# Patient Record
Sex: Female | Born: 1945 | Race: Black or African American | Hispanic: No | Smoking: Former smoker
Health system: Southern US, Community
[De-identification: ages and names within clinical notes are randomized; demographics above are authoritative.]

## PROBLEM LIST (undated history)

## (undated) DIAGNOSIS — E079 Disorder of thyroid, unspecified: Secondary | ICD-10-CM

## (undated) DIAGNOSIS — E785 Hyperlipidemia, unspecified: Secondary | ICD-10-CM

## (undated) DIAGNOSIS — I10 Essential (primary) hypertension: Secondary | ICD-10-CM

## (undated) HISTORY — PX: REPLACEMENT TOTAL KNEE: SUR1224

## (undated) HISTORY — PX: ABDOMINAL HYSTERECTOMY: SHX81

## (undated) HISTORY — PX: BUNIONECTOMY: SHX129

## (undated) SURGERY — FIXATION, FRACTURE, INTERTROCHANTERIC, WITH INTRAMEDULLARY ROD
Anesthesia: Choice | Laterality: Right

---

## 2000-06-24 ENCOUNTER — Encounter: Payer: Self-pay | Admitting: Internal Medicine

## 2000-06-24 ENCOUNTER — Encounter: Admission: RE | Admit: 2000-06-24 | Discharge: 2000-06-24 | Payer: Self-pay | Admitting: Internal Medicine

## 2001-05-28 ENCOUNTER — Encounter: Payer: Self-pay | Admitting: Internal Medicine

## 2001-05-28 ENCOUNTER — Encounter: Admission: RE | Admit: 2001-05-28 | Discharge: 2001-05-28 | Payer: Self-pay | Admitting: Internal Medicine

## 2001-06-25 ENCOUNTER — Encounter: Payer: Self-pay | Admitting: Internal Medicine

## 2001-06-25 ENCOUNTER — Encounter: Admission: RE | Admit: 2001-06-25 | Discharge: 2001-06-25 | Payer: Self-pay | Admitting: Internal Medicine

## 2001-07-06 ENCOUNTER — Ambulatory Visit (HOSPITAL_COMMUNITY): Admission: RE | Admit: 2001-07-06 | Discharge: 2001-07-06 | Payer: Self-pay | Admitting: Cardiology

## 2001-07-06 ENCOUNTER — Encounter: Payer: Self-pay | Admitting: Cardiology

## 2002-08-24 ENCOUNTER — Encounter: Admission: RE | Admit: 2002-08-24 | Discharge: 2002-08-24 | Payer: Self-pay | Admitting: Internal Medicine

## 2002-08-24 ENCOUNTER — Encounter: Payer: Self-pay | Admitting: Internal Medicine

## 2003-07-12 ENCOUNTER — Encounter: Admission: RE | Admit: 2003-07-12 | Discharge: 2003-07-12 | Payer: Self-pay | Admitting: Internal Medicine

## 2004-09-05 ENCOUNTER — Encounter: Admission: RE | Admit: 2004-09-05 | Discharge: 2004-09-05 | Payer: Self-pay | Admitting: Internal Medicine

## 2005-10-28 ENCOUNTER — Encounter: Admission: RE | Admit: 2005-10-28 | Discharge: 2005-10-28 | Payer: Self-pay | Admitting: Internal Medicine

## 2006-10-29 ENCOUNTER — Encounter: Admission: RE | Admit: 2006-10-29 | Discharge: 2006-10-29 | Payer: Self-pay | Admitting: Internal Medicine

## 2007-08-16 ENCOUNTER — Inpatient Hospital Stay (HOSPITAL_COMMUNITY): Admission: EM | Admit: 2007-08-16 | Discharge: 2007-08-24 | Payer: Self-pay | Admitting: Emergency Medicine

## 2007-08-17 ENCOUNTER — Ambulatory Visit: Payer: Self-pay | Admitting: Surgery

## 2007-08-17 ENCOUNTER — Encounter (INDEPENDENT_AMBULATORY_CARE_PROVIDER_SITE_OTHER): Payer: Self-pay | Admitting: Internal Medicine

## 2007-08-20 ENCOUNTER — Ambulatory Visit: Payer: Self-pay | Admitting: Physical Medicine & Rehabilitation

## 2007-08-24 ENCOUNTER — Inpatient Hospital Stay (HOSPITAL_COMMUNITY)
Admission: RE | Admit: 2007-08-24 | Discharge: 2007-09-01 | Payer: Self-pay | Admitting: Physical Medicine & Rehabilitation

## 2007-08-24 ENCOUNTER — Ambulatory Visit: Payer: Self-pay | Admitting: Physical Medicine & Rehabilitation

## 2007-10-12 ENCOUNTER — Encounter
Admission: RE | Admit: 2007-10-12 | Discharge: 2007-10-12 | Payer: Self-pay | Admitting: Physical Medicine & Rehabilitation

## 2007-10-12 ENCOUNTER — Ambulatory Visit: Payer: Self-pay | Admitting: Physical Medicine & Rehabilitation

## 2007-11-16 ENCOUNTER — Emergency Department (HOSPITAL_COMMUNITY): Admission: EM | Admit: 2007-11-16 | Discharge: 2007-11-17 | Payer: Self-pay | Admitting: Emergency Medicine

## 2007-12-29 ENCOUNTER — Ambulatory Visit (HOSPITAL_COMMUNITY): Admission: RE | Admit: 2007-12-29 | Discharge: 2007-12-29 | Payer: Self-pay | Admitting: Unknown Physician Specialty

## 2008-06-06 ENCOUNTER — Other Ambulatory Visit: Admission: RE | Admit: 2008-06-06 | Discharge: 2008-06-06 | Payer: Self-pay | Admitting: Nephrology

## 2009-01-02 ENCOUNTER — Inpatient Hospital Stay (HOSPITAL_COMMUNITY): Admission: RE | Admit: 2009-01-02 | Discharge: 2009-01-08 | Payer: Self-pay | Admitting: Orthopedic Surgery

## 2009-04-02 ENCOUNTER — Encounter: Admission: RE | Admit: 2009-04-02 | Discharge: 2009-04-02 | Payer: Self-pay | Admitting: Nephrology

## 2009-11-13 ENCOUNTER — Inpatient Hospital Stay (HOSPITAL_COMMUNITY): Admission: RE | Admit: 2009-11-13 | Discharge: 2009-11-17 | Payer: Self-pay | Admitting: Orthopedic Surgery

## 2010-04-04 ENCOUNTER — Encounter: Admission: RE | Admit: 2010-04-04 | Discharge: 2010-04-04 | Payer: Self-pay | Admitting: Nephrology

## 2010-09-10 ENCOUNTER — Encounter
Admission: RE | Admit: 2010-09-10 | Discharge: 2010-09-10 | Payer: Self-pay | Source: Home / Self Care | Attending: Nephrology | Admitting: Nephrology

## 2010-12-01 LAB — BASIC METABOLIC PANEL
BUN: 5 mg/dL — ABNORMAL LOW (ref 6–23)
BUN: 8 mg/dL (ref 6–23)
CO2: 26 mEq/L (ref 19–32)
CO2: 26 mEq/L (ref 19–32)
Calcium: 8.6 mg/dL (ref 8.4–10.5)
Calcium: 8.6 mg/dL (ref 8.4–10.5)
Chloride: 105 mEq/L (ref 96–112)
Chloride: 106 mEq/L (ref 96–112)
Creatinine, Ser: 0.61 mg/dL (ref 0.4–1.2)
Creatinine, Ser: 0.63 mg/dL (ref 0.4–1.2)
Creatinine, Ser: 0.63 mg/dL (ref 0.4–1.2)
GFR calc Af Amer: >60 mL/min (ref >60)
GFR calc non Af Amer: >60 mL/min (ref >60)
Glucose, Bld: 130 mg/dL — ABNORMAL HIGH (ref 70–99)
Glucose, Bld: 136 mg/dL — ABNORMAL HIGH (ref 70–99)
Glucose, Bld: 140 mg/dL — ABNORMAL HIGH (ref 70–99)
Potassium: 3.8 mEq/L (ref 3.5–5.1)
Sodium: 136 mEq/L (ref 135–145)

## 2010-12-01 LAB — CBC
HCT: 30.8 % — ABNORMAL LOW (ref 36.0–46.0)
HCT: 41.6 % (ref 36.0–46.0)
Hemoglobin: 10.2 g/dL — ABNORMAL LOW (ref 12.0–15.0)
Hemoglobin: 13.8 g/dL (ref 12.0–15.0)
Hemoglobin: 8.5 g/dL — ABNORMAL LOW (ref 12.0–15.0)
MCHC: 33.1 g/dL (ref 30.0–36.0)
MCHC: 33.1 g/dL (ref 30.0–36.0)
MCHC: 33.1 g/dL (ref 30.0–36.0)
MCHC: 33.5 g/dL (ref 30.0–36.0)
MCV: 80.9 fL (ref 78.0–100.0)
MCV: 81.4 fL (ref 78.0–100.0)
MCV: 81.7 fL (ref 78.0–100.0)
Platelets: 213 10*3/uL (ref 150–400)
Platelets: 221 K/uL (ref 150–400)
Platelets: 327 K/uL (ref 150–400)
RBC: 3.78 MIL/uL — ABNORMAL LOW (ref 3.87–5.11)
RBC: 5.14 MIL/uL — ABNORMAL HIGH (ref 3.87–5.11)
RDW: 15.6 % — ABNORMAL HIGH (ref 11.5–15.5)
RDW: 15.6 % — ABNORMAL HIGH (ref 11.5–15.5)
RDW: 15.8 % — ABNORMAL HIGH (ref 11.5–15.5)
RDW: 15.9 % — ABNORMAL HIGH (ref 11.5–15.5)
WBC: 10.7 K/uL — ABNORMAL HIGH (ref 4.0–10.5)
WBC: 11.1 10*3/uL — ABNORMAL HIGH (ref 4.0–10.5)
WBC: 6.8 K/uL (ref 4.0–10.5)

## 2010-12-01 LAB — URINALYSIS, ROUTINE W REFLEX MICROSCOPIC
Glucose, UA: NEGATIVE mg/dL
Hgb urine dipstick: NEGATIVE
Ketones, ur: 15 mg/dL — AB
Nitrite: NEGATIVE
Protein, ur: 30 mg/dL — AB
Specific Gravity, Urine: 1.026 (ref 1.005–1.030)
Urobilinogen, UA: 0.2 mg/dL (ref 0.0–1.0)
pH: 6.5 (ref 5.0–8.0)

## 2010-12-01 LAB — BASIC METABOLIC PANEL WITH GFR
BUN: 11 mg/dL (ref 6–23)
CO2: 27 meq/L (ref 19–32)
Calcium: 10.1 mg/dL (ref 8.4–10.5)
Chloride: 104 meq/L (ref 96–112)
Creatinine, Ser: 0.69 mg/dL (ref 0.4–1.2)
GFR calc non Af Amer: >60 mL/min
Glucose, Bld: 111 mg/dL — ABNORMAL HIGH (ref 70–99)
Potassium: 4.1 meq/L (ref 3.5–5.1)
Sodium: 141 meq/L (ref 135–145)

## 2010-12-01 LAB — PROTIME-INR
INR: 0.98 (ref 0.00–1.49)
INR: 1.45 (ref 0.00–1.49)
INR: 1.45 (ref 0.00–1.49)
Prothrombin Time: 12.9 s (ref 11.6–15.2)
Prothrombin Time: 17.4 seconds — ABNORMAL HIGH (ref 11.6–15.2)
Prothrombin Time: 17.5 seconds — ABNORMAL HIGH (ref 11.6–15.2)

## 2010-12-01 LAB — URINE MICROSCOPIC-ADD ON

## 2010-12-01 LAB — APTT: aPTT: 30 s (ref 24–37)

## 2010-12-17 LAB — CBC
HCT: 29.9 % — ABNORMAL LOW (ref 36.0–46.0)
Hemoglobin: 10.1 g/dL — ABNORMAL LOW (ref 12.0–15.0)
MCHC: 33.6 g/dL (ref 30.0–36.0)
MCV: 78.9 fL (ref 78.0–100.0)
Platelets: 330 10*3/uL (ref 150–400)
RBC: 3.8 MIL/uL — ABNORMAL LOW (ref 3.87–5.11)
RDW: 15.9 % — ABNORMAL HIGH (ref 11.5–15.5)
WBC: 8.6 10*3/uL (ref 4.0–10.5)

## 2010-12-17 LAB — BASIC METABOLIC PANEL
BUN: 8 mg/dL (ref 6–23)
CO2: 27 mEq/L (ref 19–32)
Calcium: 8.7 mg/dL (ref 8.4–10.5)
Chloride: 102 mEq/L (ref 96–112)
Creatinine, Ser: 0.57 mg/dL (ref 0.4–1.2)
GFR calc Af Amer: >60 mL/min (ref >60)
GFR calc non Af Amer: >60 mL/min (ref >60)
Glucose, Bld: 164 mg/dL — ABNORMAL HIGH (ref 70–99)
Potassium: 3.6 mEq/L (ref 3.5–5.1)
Sodium: 137 mEq/L (ref 135–145)

## 2010-12-17 LAB — PROTIME-INR
INR: 1.9 — ABNORMAL HIGH (ref 0.00–1.49)
INR: 2.1 — ABNORMAL HIGH (ref 0.00–1.49)
Prothrombin Time: 22.9 seconds — ABNORMAL HIGH (ref 11.6–15.2)
Prothrombin Time: 25.2 seconds — ABNORMAL HIGH (ref 11.6–15.2)

## 2010-12-18 LAB — BASIC METABOLIC PANEL
BUN: 9 mg/dL (ref 6–23)
CO2: 23 mEq/L (ref 19–32)
CO2: 24 mEq/L (ref 19–32)
CO2: 30 mEq/L (ref 19–32)
Calcium: 9.1 mg/dL (ref 8.4–10.5)
Calcium: 9.1 mg/dL (ref 8.4–10.5)
Chloride: 104 mEq/L (ref 96–112)
Creatinine, Ser: 0.6 mg/dL (ref 0.4–1.2)
Creatinine, Ser: 0.62 mg/dL (ref 0.4–1.2)
GFR calc Af Amer: >60 mL/min (ref >60)
GFR calc Af Amer: >60 mL/min (ref >60)
GFR calc Af Amer: >60 mL/min (ref >60)
GFR calc non Af Amer: >60 mL/min (ref >60)
GFR calc non Af Amer: >60 mL/min (ref >60)
Glucose, Bld: 116 mg/dL — ABNORMAL HIGH (ref 70–99)
Glucose, Bld: 126 mg/dL — ABNORMAL HIGH (ref 70–99)
Glucose, Bld: 181 mg/dL — ABNORMAL HIGH (ref 70–99)
Potassium: 3.8 mEq/L (ref 3.5–5.1)
Potassium: 4.4 mEq/L (ref 3.5–5.1)
Sodium: 137 mEq/L (ref 135–145)
Sodium: 138 mEq/L (ref 135–145)
Sodium: 140 mEq/L (ref 135–145)

## 2010-12-18 LAB — PROTIME-INR
INR: 1.1 (ref 0.00–1.49)
INR: 1.4 (ref 0.00–1.49)
Prothrombin Time: 14.4 seconds (ref 11.6–15.2)

## 2010-12-18 LAB — CBC
HCT: 28.9 % — ABNORMAL LOW (ref 36.0–46.0)
HCT: 29.3 % — ABNORMAL LOW (ref 36.0–46.0)
HCT: 41.9 % (ref 36.0–46.0)
Hemoglobin: 10.8 g/dL — ABNORMAL LOW (ref 12.0–15.0)
Hemoglobin: 13.9 g/dL (ref 12.0–15.0)
Hemoglobin: 9.7 g/dL — ABNORMAL LOW (ref 12.0–15.0)
Hemoglobin: 9.8 g/dL — ABNORMAL LOW (ref 12.0–15.0)
MCHC: 32.8 g/dL (ref 30.0–36.0)
MCHC: 33.1 g/dL (ref 30.0–36.0)
MCHC: 33.4 g/dL (ref 30.0–36.0)
MCHC: 33.6 g/dL (ref 30.0–36.0)
Platelets: 266 10*3/uL (ref 150–400)
RBC: 3.65 MIL/uL — ABNORMAL LOW (ref 3.87–5.11)
RBC: 3.7 MIL/uL — ABNORMAL LOW (ref 3.87–5.11)
RBC: 5.24 MIL/uL — ABNORMAL HIGH (ref 3.87–5.11)
RDW: 15.7 % — ABNORMAL HIGH (ref 11.5–15.5)
RDW: 15.9 % — ABNORMAL HIGH (ref 11.5–15.5)
RDW: 16 % — ABNORMAL HIGH (ref 11.5–15.5)
RDW: 16.1 % — ABNORMAL HIGH (ref 11.5–15.5)
WBC: 17.3 10*3/uL — ABNORMAL HIGH (ref 4.0–10.5)

## 2010-12-18 LAB — URINALYSIS, ROUTINE W REFLEX MICROSCOPIC
Hgb urine dipstick: NEGATIVE
Nitrite: NEGATIVE
Specific Gravity, Urine: 1.022 (ref 1.005–1.030)
Urobilinogen, UA: 1 mg/dL (ref 0.0–1.0)
pH: 6 (ref 5.0–8.0)

## 2010-12-18 LAB — TYPE AND SCREEN
ABO/RH(D): O POS
Antibody Screen: NEGATIVE

## 2010-12-18 LAB — APTT: aPTT: 30 seconds (ref 24–37)

## 2011-01-21 NOTE — Op Note (Signed)
NAME:  Stacey Lambert, Stacey Lambert NO.:  [id number]   MEDICAL RECORD NO.:  [id number]          PATIENT TYPE:  INP   LOCATION:  5002                         FACILITY:  [facility]   PHYSICIAN:  Eulas Post, MD    DATE OF BIRTH:  September 26, 1945   DATE OF PROCEDURE:  01/02/2009  DATE OF DISCHARGE:                               OPERATIVE REPORT   ATTENDING SURGEON:  Eulas Post, MD   FIRST ASSISTANT:  Skip Mayer, PA-C   PREOPERATIVE DIAGNOSIS:  Right knee valgus osteoarthritis.   POSTOPERATIVE DIAGNOSIS:  Right knee valgus osteoarthritis.   OPERATIVE PROCEDURE:  Right total knee arthroplasty.   OPERATIVE IMPLANTS:  DePuy PFC Sigma posterior stabilized size 4 narrow  femoral component with a size 3 tibial tray and a size 8-mm fixed  bearing PFC Sigma cross-link stabilized insert with a size 38-mm oval  bone patella, 3 peg button and 40 grams of SmartSet viscosity bone  cement.   PREOPERATIVE INDICATIONS:  Mrs. Stacey Lambert is a 65 year old woman who  had severe end-stage right knee arthritis.  She had failed conservative  management and elected to undergo right total knee arthroplasty.  The  risks, benefits, and alternatives were discussed with her preoperatively  including but not limited risks of infection, bleeding, nerve injury,  instability, the need for revision surgery, stiffness, blood clots,  blood transfusion, cardiopulmonary complications, among others and she  was willing to proceed.   OPERATIVE PROCEDURE:  The patient was brought to the operating room and  placed in supine position.  General anesthesia was administered.  Intravenous Ancef 1 gram was given.  A Foley was placed.  The right  lower extremity was prepped and draped in the usual sterile fashion.  The leg was elevated, exsanguinated, and the tourniquet was inflated.  She had a preoperative flexion contracture of at least 25 degrees, maybe  30 degrees.  She also had slight valgus deformity.  We made  an anterior  midline incision with a vastus medialis muscle splitting approach.  The  patella was everted.  We removed osteophytes and resected our proximal  tibia taking care to protect the collateral ligaments.  Minimal medial  dissection was performed.  We resected 4 mm off the medial side, taking  care to take as little as possible off the lateral side.  We turned our  attention to the femur.  We used the intramedullary femoral cutting jig  and resected the distal femur at 11 mm.  The jig was set to 5 degrees  right.  After resecting this, we tested our extension gap which had  significant flexion contracture and tightness and did not balance  adequately.  Therefore, we resected two more millimeters of tibia.  We  then turned our attention back to the femur.  We resected the menisci  and placed our sizing guide.  There was hypoplasia of the lateral  femoral condyle and we externally rotated the guide appropriately using  a Freer interposed between the bone and the guide in order to achieve  external rotation.  We then pinned this and measured  the femur and  placed our 4-in-1 cutting jig.  The cut was flushed with the anterior  bone and there was no notching.  The posterior condyles were  appropriately resected.  We then used a osteotome and curette to remove  the large hypertrophic posterior osteophytes.  We resected the PCL and  the ACL and recessed the posterior capsule.  We also performed our  chamfer cuts.   We then turned our attention to measuring the gaps.  The flexion gap was  symmetric at 10 mm at this point, but the extension gap was still fairly  tight at 10 mm.  We released the popliteus as well as resected the  capsule along the posterior tibia and posterior femur in order to  achieve more extension.  We then retrialed our blocks and the extension  gap was much improved, although it was still slightly tight at 10 mm.   We turned our attention to the patella.  The  patella measured 24 mm and  afterwards measured 15.5 mm.  We sized it to a 38 and drilled the button  holes.  We placed trial components and the patella was found to have  excellent tracking.  Decision was made to perform a fixed bearing knee,  in part because the patella tracked well, but also because it allowed [place]  the option to go down to an 8-mm insert.  Therefore, the proximal tibia  was prepared and appropriate external rotation achieved and then we  removed all the instruments and irrigated the knee copiously.   We then cemented all of the components into place.  A 10-mm trial was  placed, which still had residual 7-degree flexion contracture.  The  overall alignment was much improved.  The patella tracked very well.  Once all the cement had cured, we changed to an 8-mm insert which  allowed the knee to go to full extension.  It had excellent flexion up  to 120 degrees.  We irrigated the wounds copiously once more and  released the tourniquet.  Total tourniquet time was 2 hours at 350 mmHg.  We then irrigated again and closed the parapatellar tissue with #1  Vicryl followed by 2-0 and 3-0 for the subcutaneous tissue followed by 4-  0 Monocryl and Steri-Strips with sterile gauze for the skin.  The  patient was awakened and returned to PACU in stable and satisfactory  condition.  There were no complications.  The patient tolerated the  procedure well.      Eulas Post, MD  Electronically Signed     JPL/MEDQ  D:  01/02/2009  T:  01/03/2009  Job:  [id number]

## 2011-01-21 NOTE — Discharge Summary (Signed)
NAME:  Stacey Lambert, Stacey Lambert NO.:  [id number]   MEDICAL RECORD NO.:  [id number]          PATIENT TYPE:  IPS   LOCATION:  4039                         FACILITY:  [facility]   PHYSICIAN:  Ranelle Oyster, M.D.DATE OF BIRTH:  [birth year]   DATE OF ADMISSION:  08/24/2007  DATE OF DISCHARGE:  09/01/2007                               DISCHARGE SUMMARY   DISCHARGE DIAGNOSES:  1. Left hemispheric infarction.  2. Status post non-ST myocardial infarction.  3. Hypertension.  4. Depression.  5. Hyperlipidemia.  6. Hyperthyroidism.  7. Borderline diabetes mellitus.   This is a 65 year old female admitted December 8 with right-sided  weakness.  MRI showed a 3-mm infarction white matter left hemisphere.  Carotid Dopplers negative for internal carotid artery stenosis.  Echocardiogram with ejection fraction 40% with severe hypokinesis.  Routine cardiac enzymes positive. EKG showed T-wave inversion anterior  leads suspect non-ST myocardial infarction.  Cardiac catheterization  December 10 per Dr. Aleen Campi with normal coronary arteries.  Placed on  aspirin, Plavix therapy.  Noted hypothyroidism with TSH of 0.007 with a  home supplement of 150 mcg decreased to 50 mg. Blood sugars monitored in  the 120s to 140s with diet control added.   PAST MEDICAL HISTORY:  See discharge diagnoses.  No alcohol.  Noted  history of tobacco use.   ALLERGIES:  Negative.   SOCIAL HISTORY:  Widowed, lives alone.  She works for a [employer], local family works, one level home, one step to entry.   MEDICATIONS PRIOR TO ADMISSION:  1. Zebeta 10 mg daily.  2. Synthroid 150 mcg daily.  3. Pravachol 20 mg at bedtime.  4. Lisinopril 10 mg daily.  5. Celexa 40 mg daily.   REHABILITATION AND HOSPITAL COURSE:  The patient was admitted to  inpatient rehab services with therapies initiated on a 3-hour daily  basis consisting of physical therapy, occupational therapy and  rehabilitation nursing. The  following issues were addressed during the  patient's rehabilitation stay.  Pertaining to [name]'s left  hemispheric infarction, she remained on aspirin and Plavix therapy.  Functionally she was progressing in all areas requiring basic  supervision for transfers, minimum assist for stairs, ambulating 150  feet with a rolling walker, distant supervision for activities of daily  living.  Blood pressure is monitored on lisinopril,  Lopressor with no  orthostatic changes. Diastolic pressure is 73-82.  She had a history of  depression.  She remained on Celexa as prior hospital admission.  She  had no bowel or bladder disturbances. She was discharged to home in  stable condition.   DISCHARGE MEDICATIONS:  1. Aspirin 325 mg daily.  2. Protonix 40 mg daily.  3. Lisinopril 20 mg daily.  4. Celexa 40 mg daily.  5. Lopressor 25 mg every 6 hours.  6. Zocor 40 mg daily.  7. Xanax 0.5 mg twice daily.  8. Lyrica 75 mg twice daily.  9. Plavix 75 mg daily.  10.Synthroid 50 mcg daily.  11.Tylenol as needed.   ACTIVITY:  As tolerated.   DIET:  Diabetic diet.   SPECIAL  INSTRUCTIONS:  Follow-up with Dr. Faith Rogue at the  outpatient rehab service office as advised.  Follow-up with primary care  physician in 2 weeks.      Stacey Lambert, P.A.      Ranelle Oyster, M.D.  Electronically Signed    DA/MEDQ  D:  08/31/2007  T:  08/31/2007  Job:  [id number]   cc:   Antionette Char, MD  Massie Maroon, MD  Pramod P. Pearlean Brownie, MD

## 2011-01-21 NOTE — Assessment & Plan Note (Signed)
HISTORY:  Stacey Lambert is back regarding her stroke.  She was on rehab until  September 01, 2007, and has been at home.  She has received home  therapies and essentially has been discharged.  Her main issue is more  related to her chronic knee pain, right more than left.  She requires a  cane for balance and has a significant limp due to her right knee pain.  She also feels that the right knee is going to give away.  She still has  a bit of weakness on the right side and occasional spasms, as related to  her stroke.  She has some loss of eye motor coordination as well, but is  very happy overall with her neurological progress.  Sleep is fair.  She  says the pain is worse with walking and standing and improves with rest,  heat and medications.   SOCIAL HISTORY:  The patient had been working as a Office manager person,  managing a group of guards, etc.  Her job required a lot of walking and  she does not know if she will be able to return to that, due to her  stroke and arthritic issues.   REVIEW OF SYSTEMS:  Notable for the above.  She has occasional night  sweats and shivers.  Other pertinent positives are above.   PHYSICAL EXAMINATION:  VITAL SIGNS:  Blood pressure 166/87, pulse 54,  respirations 16, saturation 98% on room air.  GENERAL:  The patient is pleasant, alert and oriented x3.  She remains  obese.  NEUROLOGIC/EXTREMITIES:  I watched her ambulate and she has significant  valgus deformities, right greater than left with significant antalgic to  the right with weightbearing.  Range of motion is a bit limited,  particularly with flexion, which causes pain.  She has significant  crepitus with range of motion both actively and passively.  Some  parapatellar edema is noted as well.  No lymph or skin breakdown.  There  are color changes noted at the knees today.  She had 4+/5 strength in  the right arm and leg.  She lacked some fine motor coordination of the  hand and foot, but minimally so today.   Cognition is appropriate.  Speech was articulate and clear.  Cranial nerve exam was intact and  within normal limits.  HEART:  Regular rhythm but slightly bradycardic.  CHEST:  Clear.  ABDOMEN:  Soft, nontender.   ASSESSMENT:  1. Left subcortical stroke.  2. Hypertension.  3. Osteoarthritis of the knees, right greater than left.  4. History of depression.  5. Obesity.  6. Borderline diabetes.   PLAN:  1. After an informed consent, we injected the right knee with 40 mg of      Kenalog and 3 mL of 1% lidocaine.  2. I ordered her an AVQ ArthroCare brace with bilateral stability      supports, to see if she can improve her stability in ambulation.      She will likely ultimately need knee replacements.  I encouraged      weight loss and strengthening of the limbs.   FOLLOWUP:  I will see her back in about three months' time.  She will  call me with any questions or issues.      Ranelle Oyster, M.D.  Electronically Signed     ZTS/MedQ  D:  10/12/2007 11:03:32  T:  10/12/2007 16:37:08  Job #:  [id number]   cc:   Massie Maroon, MD  Fax: [phone]

## 2011-01-21 NOTE — Consult Note (Signed)
NAME:  Stacey Lambert, Stacey Lambert                 ACCOUNT NO.:  [id number]   MEDICAL RECORD NO.:  [id number]          PATIENT TYPE:  INP   LOCATION:  4705                         FACILITY:  [facility]   PHYSICIAN:  Pramod P. Pearlean Brownie, MD    DATE OF BIRTH:  [birth year]   DATE OF CONSULTATION:  08/16/2007  DATE OF DISCHARGE:                                 CONSULTATION   REASON FOR CONSULTATION:  TIA.   HISTORY OF PRESENT ILLNESS:  Stacey Lambert is a 65 year old, Caucasian lady  who initially noticed right leg paresthesias yesterday evening.  She  went to sleep and woke up this morning and she was alright, but at about  8 a.m., she noticed transient, right-sided weakness and paresthesias  lasting about 5-10 minutes and recovered.  She has had a total of six  recurrent episodes today including two of them in the emergency room as  well.  I saw her briefly during one of the episodes when she clearly had  some right facial droop and mild right-sided weakness and some  subjective paresthesias, but this resolved within a few minutes.  She  has had no known prior history of stroke or TIAs.   PAST MEDICAL HISTORY:  1. Hypertension.  2. Hyperlipidemia.  3. Hypothyroidism.   HOME MEDICATIONS:  Pravachol, Synthroid, lisinopril, Celexa.   ALLERGIES:  No known drug allergies.   SOCIAL HISTORY:  The patient lives alone.  She is independent in  activities of daily living.  Does not smoke or drink.   FAMILY HISTORY:  Noncontributory.   REVIEW OF SYSTEMS:  Negative for any fever, cough, chest pain, diarrhea  or other illness.   PHYSICAL EXAMINATION:  GENERAL:  A pleasant, middle-aged, African  American lady who at present not in distress.  VITAL SIGNS:  She is febrile.  Pulse rate of 82 per minute and regular,  blood pressure 189/90, respiratory 16, temperature 97.9, saturations 98%  room air.  HEENT:  Head is nontraumatic.  Unremarkable.  NECK:  Supple.  There is no bruit.  CARDIAC:  Regular heart sounds.  LUNGS:  Clear to auscultation.  ABDOMEN:  Soft, nontender.  NEUROLOGIC:  The patient is awake, alert, oriented to time, place and  person.  There is no aphasia, no nystagmus.  Face is symmetric.  Tongue  is midline.  Motor system exam with normal symmetric strength, tone,  reflexes, coordination and sensation.  Her gait was not tested.   LABORATORY DATA AND X-RAY FINDINGS:  CT scan of the head with  noncontrast study reveals no acute abnormality.   IMPRESSION:  A 65-year lady with recurrent transient episodes of right-  sided weakness and numbness which have improved.  This likely represents  left hemispheric transient ischemic attack with small infarct.  Etiology  is most likely small-vessel disease.  The patient is being admitted to  the hospital service for further workup.   RECOMMENDATIONS:  I would recommend checking MRI scan of the brain with  MRA of the brain, carotid transcranial Doppler studies, 2-D  echocardiogram, lipid profile, hemoglobin A1c, homocystine and telemetry  monitoring.  Start  aspirin for secondary stroke prevention and will be  happy to follow the patient in consult.  Kindly call for questions.           ______________________________  Sunny Schlein. Pearlean Brownie, MD     PPS/MEDQ  D:  08/16/2007  T:  08/17/2007  Job:  [id number]

## 2011-01-21 NOTE — Discharge Summary (Signed)
NAME:  Stacey Lambert, Stacey Lambert                 ACCOUNT NO.:  [id number]   MEDICAL RECORD NO.:  [id number]          PATIENT TYPE:  INP   LOCATION:  4738                         FACILITY:  [facility]   PHYSICIAN:  Herbie Saxon, MDDATE OF BIRTH:  July 09, 1946   DATE OF ADMISSION:  08/16/2007  DATE OF DISCHARGE:                               DISCHARGE SUMMARY   This is an interim discharge summary.   DISCHARGE DIAGNOSES:  1. Acute left cerebrovascular accident.  2. Myocarditis.  3. Cardiomyopathy with left ventricular dysfunction.  4. Dyslipidemia.  5. Depression, anxiety.  6. Knee osteoarthritis.  7. History of tobacco use.  8. Hypothyroidism.  9. Restless legs syndrome.   CONSULTS:  1. Dr. Pearlean Brownie, neurology.  2. Dr. Aleen Campi, cardiology.   The MRI of the brain of August 16, 2007 did show an acute infract in  the left cerebral hemisphere.  The patient had cardiac catheterization  performed on August 18, 2007 by the cardiologist, Dr. Aleen Campi.  This  revealed normal coronary arteries; however, she was noticed to have left  ventricular dysfunction.  The patient was continued on Zebeta, and  lisinopril dose was increased.   The patient's right hemiplegia has been gradually improving.  At  present, she is ambulating with physical therapy support.  Her blood  pressure has been adequately controlled, and her mood is stable.  This  patient is agreeable to rehab placement.  Also, her daughter is  agreeable with this plan.   She was transferred from the intensive care unit by August 19, 2007,  and her neurological function has been improved since then.  Her  swallowing evaluation showed no overt signs of aspiration.  The ejection  fraction is 40% with severe hypokinesia and focal akinesia on the medial  and anterior walls.   Her TSH was noted to be low, and Synthroid dose has been reduced to 100  mcg daily.  She has been continued on aspirin and Plavix.  Neurology  does not feel there  is any need for warfarin at this stage.  Synthroid  dose was further reduced to 50 mcg daily.  The repeat TSH was low at  0.007.   Diabetes control is stable while awaiting CRR bed placement.   DISCHARGE CONDITION:  Stable.   Final medications to be dictated on discharge.   PHYSICAL EXAMINATION:  On examination today, she is an elderly lady in  no acute distress.  Temperature 98, pulse 80, respiratory rate 20, blood pressure 130/70.  Pupils are equal, round and reactive to light and accommodation.  Extraocular muscles are intact.  Heart sounds 1 and 2 are regular.  CHEST:  Clinically clear.  ABDOMEN:  Soft, nontender.  No organomegaly.  Bowel sounds are  normoactive.  She is alert and oriented x3.  She has mild right hemiparesis.  Cranial  nerves II-XII are intact.  Peripheral pulses are present, no pedal  edema.  She has mild ataxia.   Glucose level is 131.  WBC is 9, hematocrit 36, platelet count 279.  The  TSH on August 20, 2007 showed a level of 0.007.  It is noted that the  initial leukocytosis has resolved.   DISPOSITION:  To CRR rehab with PT/OT followup.  She is to be seen as an  outpatient.  Will be followed up by her primary care physician, Dr. Selena Batten,  one week after discharge.  To follow up with Dr. Aleen Campi in 2-3 weeks  after discharge.  Dr. Pearlean Brownie, neurologist, 4-6 weeks after discharge.   At present, the medications she is on include aspirin 325 mg daily,  Protonix 40 mg daily, lisinopril 20 mg daily, Celexa 40 mg daily,  Lopressor 25 mg q.6h. p.o., Zocor 40 mg daily, Xanax 0.5 mg b.i.d.,  Lyrica 75 mg b.i.d., Plavix 75 mg daily, Synthroid 50 mcg daily, Reglan  70 mg p.o. q.8h. p.r.n., oxycodone 5 mg q.6h. p.r.n., Ambien 5-10 mg  nightly p.r.n.   This could be updated if patient is not discharged today.      Herbie Saxon, MD  Electronically Signed     MIO/MEDQ  D:  08/23/2007  T:  08/23/2007  Job:  [id number]

## 2011-01-21 NOTE — H&P (Signed)
NAME:  Stacey Lambert, Stacey Lambert                 ACCOUNT NO.:  [id number]   MEDICAL RECORD NO.:  [id number]          PATIENT TYPE:  EMS   LOCATION:  MINO                         FACILITY:  [facility]   PHYSICIAN:  Ladell Pier, M.D.   DATE OF BIRTH:  [birth year]   DATE OF ADMISSION:  08/16/2007  DATE OF DISCHARGE:                              HISTORY & PHYSICAL   CHIEF COMPLAINT:  Right sided weakness.   HISTORY OF PRESENT ILLNESS:  The patient is a 65 year old African-  American female with past medical history significant for hypertension,  dyslipidemia, and tobacco use. The patient stated that last night,  before she went to bed, she noted that her right side was weak. She  thought she was having cramps in her right lower extremity when she  tried to use her left hand to raise herself up off the bed to switch  positions that was difficult to do secondary to weakness in her left  hand. Went to bed, woke up this morning, went to work, and when she was  trying to use the keyboard, she noticed that her fingers just would not  work. When she tried to stand up, she noticed that her right leg was  weak again. She just felt bad all over. She felt nauseated but she had  no chest pain, no shortness of breath. She also noted some right facial  droop and difficulty finding words with slurred speech. She is now back  to baseline.   PAST MEDICAL HISTORY:  1. Osteoarthritis of the knees.  2. Hypertension.  3. Dyslipidemia.  4. Depression/anxiety.  5. Osteoarthritis of the knee.  6. Tobacco use.  7. Hypothyroidism.   FAMILY HISTORY:  Mother is 72 years old with hypertension, diabetes, and  history of stroke. Father is unknown. Siblings with hypertension.   SOCIAL HISTORY:  The patient smokes about 1/4 pack of cigarettes per  day. No alcohol use. She is a widow and she has 4 sons. She works at [employer].   MEDICATIONS:  The patient could not remember the list of mediations but  the ones from  the pharmacy that were recently refilled was Zebeta 10 mg  daily, Synthroid 150 mcg daily, Pravachol 20 mg q.h.s., Lisinopril 10 mg  daily, and Celexa 40 mg daily.   ALLERGIES:  NO KNOWN DRUG ALLERGIES.   REVIEW OF SYSTEMS:  See HPI.   PHYSICAL EXAMINATION:  VITAL SIGNS:  Temperature 97.7, blood pressure  190/92, pulse 67, respiratory rate 18, pulse ox 98% on room air.  HEENT:  Normocephalic and atraumatic. Pupils are reactive to light.  Throat without erythema.  CARDIOVASCULAR:  Regular rate and rhythm.  LUNGS:  Clear bilaterally.  ABDOMEN:  Positive bowel sounds.  EXTREMITIES:  Without edema.  NEUROLOGIC:  Examination is non-focal.   LABORATORY DATA:  Sodium 139, potassium 3.7, chloride 107, BUN 12,  creatinine of 0.6. Hemoglobin and hematocrit 17 and 50. WBC 10.8.  Hemoglobin 14.4. MCV of 78.9. Platelets 385,000. Urinalysis negative.   Head CT without any acute ischemia.   ASSESSMENT/PLAN:  1. Transient ischemic attack. Will  admit patient to the hospital. She      was taking a baby aspirin a day. Will increase that to a regular      aspirin. Will get carotid Doppler's, 2-D echocardiogram, fasting      lipid panel, cardiac enzymes, MRA, and also consult neurology.  2. Hypothyroidism. Will check a TSH level.  3. Dyslipidemia. Will check a fasting lipid panel. Continue Pravachol.  4. Hypertension. Blood pressure is mildly elevated now. Will restart      her no Zebeta and increase her Lisinopril to 20 mg daily.  5 .  Anxiety/depression. Will continue her on Celexa.      Ladell Pier, M.D.  Electronically Signed     NJ/MEDQ  D:  08/16/2007  T:  08/16/2007  Job:  [id number]   cc:   Massie Maroon, MD

## 2011-01-21 NOTE — Discharge Summary (Signed)
NAME:  Stacey Lambert, Stacey Lambert NO.:  [id number]   MEDICAL RECORD NO.:  [id number]          PATIENT TYPE:  IPS   LOCATION:  4039                         FACILITY:  [facility]   PHYSICIAN:  Ellwood Dense, M.D.   DATE OF BIRTH:  11/17/1945   DATE OF ADMISSION:  08/24/2007  DATE OF DISCHARGE:                               DISCHARGE SUMMARY   PRIMARY CARE PHYSICIAN:  Massie Maroon, MD.   NEUROLOGIST:  Pramod P. Pearlean Brownie, MD.   CARDIOLOGIST:  Antionette Char, MD.   HISTORY OF PRESENT ILLNESS:  Ms. Manner is a 65 year old African-  American female who was admitted August 24, 2007 with acute onset of  right-sided weakness.  MRI study showed 3 mm infarct in the left  hemisphere white matter.  Carotid Dopplers were negative for internal  carotid artery stenosis.  Echocardiogram showed ejection fraction of 40%  with severe hypokinesis.  Routine cardiac enzymes were positive and EKG  showed T-wave inversion of the anterior leads with suspected non-ST wave  elevated MI.  Cardiac catheterization was done August 18, 2007 by Dr.  Aleen Campi with normal coronary arteries reported.  She was placed on  aspirin and Plavix for stroke and myocardial infarction prophylaxis.  She was noted to have hypothyroidism with TSH of 0.07 and her home  Synthroid was decreased to 50 mcg p.o. daily.  Blood sugars have been in  the 120-140 range and hemoglobin A1c is pending with continued CBGs a.c.  and h.s.  The patient was evaluated by the rehabilitation physicians and  felt to be an appropriate candidate for inpatient rehabilitation.   REVIEW OF SYSTEMS:  Positive for depression and reflux.   PAST MEDICAL HISTORY:  1. Hypertension.  2. Dyslipidemia.  3. Depression.  4. Hypothyroidism.   FAMILY HISTORY:  Positive for diabetes mellitus and stroke.   SOCIAL HISTORY:  The patient is widowed and lives alone.  She works for  [employer] which apparently provides security to [employer].  She reports  use of tobacco on a daily basis and denies alcohol usage.  She lives in a one-level home.  Her local family works.   FUNCTIONAL HISTORY PRIOR TO ADMISSION:  Independent and working.   ALLERGIES:  NO KNOWN DRUG ALLERGIES.   PRIOR TO ADMISSION MEDICATIONS:  1. Zebeta 10 mg daily.  2. Synthroid 150 mcg p.o. daily.  3. Pravachol 20 mg p.o. q.h.s.  4. Lisinopril 10 mg daily.  5. Celexa 40 mg daily.   LABORATORY:  Recent hemoglobin was 12.2, hematocrit 36.0, platelet count  of 279,000, white count 9.1.  Recent CBGs have been 141, 128 and 104.  TSH was low at 0.007.  Hemoglobin A1c is pending.  Recent sodium was  139, potassium 3.7, chloride 106, bicarbonate 25, BUN 11, creatinine  0.7.   PHYSICAL EXAMINATION:  GENERAL:  Reasonably well-appearing, alert,  cooperative adult female seated up on the edge of the bed eating lunch.  VITAL SIGNS:  Blood pressure is 110/62 with pulse 67, straight ATN,  temperature 97.0.  HEENT: Normocephalic, nontraumatic.  CARDIOVASCULAR:  Regular rate and rhythm,  S1-S2 without murmurs.  ABDOMEN:  Soft, moderately obese, nontender with positive bowel sounds.  LUNGS:  Clear to auscultation bilaterally.  NEUROLOGIC:  Alert and oriented x 3.  Cranial nerves II-XII are grossly  intact.  EXTREMITIES:  Left upper left lower extremity strength were 4+ to -5/5  strength throughout.  Bulk and tone were normal.  Reflexes 2+ and  symmetrical.  Sensation was intact to light touch throughout the left  upper and left lower extremity.  Right upper extremity strength was 4-/5  with decreased coordination and movements distally in the right hand.  Sensation was intact to light touch with slight decrease distally in the  right upper extremity.  Right lower extremity exam showed 4-/5 strength  in flexion/extension ankle dorsiflexion.  Sensation was intact to light  touch throughout the right lower extremity.   IMPRESSION:  1. Status post left hemisphere infarct with  right-sided weakness, arm      greater than leg.  2. Recent cardiac catheterization for abnormal echocardiogram with      normal coronary arteries reported.  3. Hypothyroidism on supplement.  4. Hypertension.  5. Dyslipidemia.   Presently, the patient has deficits in ADLs, transfers and ambulation  related to the above noted left hemisphere infarct with right-sided  weakness.   PLAN:  1. Admit to the rehabilitation unit for daily physical therapy for      range of motion, strengthening, bed mobility, transfers, pre-gait      training, gait training and equipment evaluation.  2. Occupational therapy for range of motion, strengthening, ADLs,      cognitive/perceptual training, splinting and equipment evaluation.  3. Rehab nursing for skin care, wound care, bowel and bladder training      if necessary.  4. Case management to assess home environment, assist with discharge      planning and arrange for appropriate follow up care.  5. Social worker to assess family and social support, consultation      regarding disability issues and assist in discharge planning.  6. Continue high carbohydrate and modified diet.  7. CBGs a.c. and h.s.  8. Check admission labs including CBC and CMET in a.m. August 25, 2007.  9. Old EKG to chart.  10.Routine turning to prevent skin breakdown.   DISCHARGE MEDICATIONS:  1. Sliding-scale NovoLog insulin a.c. without h.s. coverage.  2. Aspirin 325 mg p.o. daily.  3. Protonix 40 mg p.o. daily.  4. Lisinopril 20 mg p.o. daily.  5. Celexa 40 mg p.o. daily.  6. Lopressor 25 mg p.o. q.6 h. holding for systolic blood pressure      less than 90 or heart rate less than 60.  7. Zocor 40 mg p.o. daily.  8. Xanax 0.5 mg p.o. b.i.d.  9. Lyrica 75 mg p.o. b.i.d.  10.Plavix 75 mg p.o. daily.  11.Synthroid 15 mcg p.o. daily.  12.Dulcolax suppository 1 per rectum daily p.r.n.   PROGNOSIS:  Good.   ESTIMATED LENGTH OF STAY:  5-10 days.   GOALS:  Modified  independent ADLs, transfers and ambulation.           ______________________________  Ellwood Dense, M.D.     DC/MEDQ  D:  08/24/2007  T:  08/24/2007  Job:  [id number]

## 2011-01-21 NOTE — Discharge Summary (Signed)
NAME:  Stacey Lambert, Stacey Lambert                 ACCOUNT NO.:  [id number]   MEDICAL RECORD NO.:  [id number]          PATIENT TYPE:  INP   LOCATION:  5002                         FACILITY:  [facility]   PHYSICIAN:  Eulas Post, MD    DATE OF BIRTH:  [birth year]   DATE OF ADMISSION:  01/02/2009  DATE OF DISCHARGE:  01/06/2009                               DISCHARGE SUMMARY   ADMISSION DIAGNOSIS:  Right knee valgus osteoarthritis.   POSTOPERATIVE OR DISCHARGE DIAGNOSIS:  Right knee valgus osteoarthritis.   PROCEDURE:  Right total knee arthroplasty, performed on January 02, 2009.   DISCHARGE MEDICATIONS:  Percocet, Valium, Phenergan, Coumadin.   HOSPITAL COURSE:  [name] is a 65 year old woman with severe  valgus osteoarthritis with a severe flexion contracture.  She elected to  undergo total knee arthroplasty.  The risks, benefits, and alternatives  were discussed.  She tolerated the procedure well and postoperatively  did not have any complications.  She was given IV PCA Dilaudid for pain  control early on, and then switched to p.o. analgesics.  She was given  perioperative antibiotics for an antimicrobial prophylaxis.  She was  given sequential compression devices and Coumadin for DVT prophylaxis.  She was ambulating well at the time of discharge with physical therapy.  Her wounds were clean, dry, and intact, and her dressings were changed  prior to discharge.  She remained hemodynamically stable and did not  require blood transfusion.  She benefited maximally from her hospital  stay and planned to be discharged home with followup with me in 2 weeks.  There were no complications.      Eulas Post, MD  Electronically Signed     JPL/MEDQ  D:  01/05/2009  T:  01/06/2009  Job:  [id number]

## 2011-01-21 NOTE — Cardiovascular Report (Signed)
NAMEMarland Kitchen  Stacey Lambert, Stacey Lambert                 ACCOUNT NO.:  [id number]   MEDICAL RECORD NO.:  [id number]          PATIENT TYPE:  INP   LOCATION:  2116                         FACILITY:  [facility]   PHYSICIAN:  Antionette Char, MD    DATE OF BIRTH:  January 05, 1946   DATE OF PROCEDURE:  08/18/2007  DATE OF DISCHARGE:                            CARDIAC CATHETERIZATION   PROCEDURES:  1. Left heart catheterization.  2. Coronary sine angiography.  3. Left ventricular cineangiography.  4. Abdominal aortogram.  5. Angio-Seal of the right femoral artery.   INDICATION FOR PROCEDURES:  This 65 year old female was admitted after a  stroke presentation to the emergency room.  She also had cardiac enzymes  done which were positive and her electrocardiogram changed showing T  wave inversion in her anterior leads.  With abnormal cardiac enzymes and  change in EKG, she was felt to have a non-ST elevation myocardial  infarction.  Because of the high risk for having a cardiac event, she  was then scheduled for a cardiac cath.   PROCEDURE:  After signing an informed consent, the patient was pre-  medicated with 5 mg of Valium by mouth and brought to the cardiac  catheterization lab at [facility].  Her right groin was prepped  and draped in a sterile fashion and anesthetized locally with 1%  lidocaine.  A 7-French introducer sheath was inserted percutaneously  into the right femoral artery using a Smart needle for guidance into the  artery.  A 6-French #4 Judkins coronary catheters were used to make  injections into the native coronary arteries.  A 6-French pigtail  catheter was used to measure pressures in the left ventricle and aorta  and to make mid stream injections into the left ventricle and abdominal  aorta.  The patient tolerated the procedure well and no complications  were noted.  At the end of the procedure, the catheter and sheath were  removed from the right femoral artery and hemostasis was  easily obtained  with an Angio-Seal closure system.   MEDICATIONS GIVEN:  None.   HEMODYNAMIC DATA:  There was no gradient across the aortic valve.   CINE FINDINGS:  1. Coronary cineangiography:      a.     Left coronary artery:  The ostium of the left coronary       artery is short with the appearance of separate origin of the       circumflex and LAD arteries.  Injections were made into both the       LAD large intermediate branch and circumflex are all normal.  Both       the LAD and circumflex are large vessels with the LAD wrapping       around the apex supplying the posterior descending circulation and       the circumflex going around the AV groove supplying the distal       right coronary artery distribution.  The entire left coronary       artery system appears normal otherwise without evidence for  significant plaque.  There was normal antegrade flow and normal       distal runoff.      b.     Right coronary artery:  The right coronary artery is a small       nondominant vessel and is normal in appearance.  2. Left ventricular cineangiogram:  The left ventricular chamber size      is mild to moderately enlarged.  The left ventricular wall      thickness appears normal.  The overall left ventricular      contractility is decreased with an ejection fraction of      approximately 40%.  There is severe apical hypokinesia.  There is      akinesia of a small area in the mid anterior wall.  There is no      evidence of mitral insufficiency.  3. Abdominal aortogram:  Abdominal aorta, renal and iliac arteries      appear normal.   FINAL DIAGNOSES:  1. 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  2. Normal coronary arteries.  3. Normal abdominal aorta and renal arteries.  4. Successful Angio-Seal of the  right femoral artery.   DISPOSITION:  With an acute process of her myocardium being present with  elevated enzymes and normal coronary arteries, this may be the etiology  of her stroke.  The left ventricular function may have been worse and  may be the etiology of her stroke with an embolus.  Her prognosis from  her myocarditis at this point is uncertain and will certainly need to be  continued on anticoagulation and add carvedilol for her left ventricular  dysfunction.  We will plan to follow up periodically with  echocardiograms to assess her left ventricular function.      Antionette Char, MD  Electronically Signed     JRT/MEDQ  D:  08/18/2007  T:  08/18/2007  Job:  [id number]   cc:   Cath Lab

## 2011-03-06 ENCOUNTER — Other Ambulatory Visit: Payer: Self-pay | Admitting: Nephrology

## 2011-03-06 DIAGNOSIS — Z1231 Encounter for screening mammogram for malignant neoplasm of breast: Secondary | ICD-10-CM

## 2011-04-07 ENCOUNTER — Ambulatory Visit
Admission: RE | Admit: 2011-04-07 | Discharge: 2011-04-07 | Disposition: A | Discharge status: Home / Self Care | Payer: Medicare Other | Source: Ambulatory Visit | Attending: Nephrology | Admitting: Nephrology

## 2011-04-07 DIAGNOSIS — Z1231 Encounter for screening mammogram for malignant neoplasm of breast: Secondary | ICD-10-CM

## 2011-06-02 LAB — I-STAT 8, (EC8 V) (CONVERTED LAB)
Acid-base deficit: 1
Chloride: 109
HCT: 47 — ABNORMAL HIGH
Operator id: 277751
Potassium: 3.5
TCO2: 24
pCO2, Ven: 34 — ABNORMAL LOW
pH, Ven: 7.432 — ABNORMAL HIGH

## 2011-06-02 LAB — POCT I-STAT CREATININE
Creatinine, Ser: 0.7
Operator id: 277751
Operator id: 277751

## 2011-06-13 LAB — DIFFERENTIAL
Basophils Absolute: 0
Basophils Relative: 1
Eosinophils Absolute: 0.5
Eosinophils Relative: 7 — ABNORMAL HIGH
Lymphocytes Relative: 33
Lymphs Abs: 2.4
Monocytes Absolute: 0.7
Monocytes Relative: 10
Neutro Abs: 3.4
Neutrophils Relative %: 48

## 2011-06-13 LAB — CBC
HCT: 40.4
Hemoglobin: 13.2
MCHC: 32.5
MCV: 79.6
Platelets: 358
RBC: 5.08
RDW: 16 — ABNORMAL HIGH
WBC: 7

## 2011-06-13 LAB — COMPREHENSIVE METABOLIC PANEL
AST: 19
Albumin: 3.4 — ABNORMAL LOW
BUN: 11
Calcium: 9.8
Creatinine, Ser: 0.8
GFR calc Af Amer: >60
Total Protein: 6.9

## 2011-06-16 LAB — CBC
HCT: 36.3
HCT: 38
HCT: 44
Hemoglobin: 12.2
Hemoglobin: 12.5
Hemoglobin: 14.4
MCV: 78.1
MCV: 78.8
MCV: 78.9
Platelets: 274
Platelets: 301
RBC: 4.65
RBC: 4.65
RBC: 4.68
RBC: 4.82
RBC: 5.57 — ABNORMAL HIGH
WBC: 10.8 — ABNORMAL HIGH
WBC: 10.9 — ABNORMAL HIGH
WBC: 11.1 — ABNORMAL HIGH
WBC: 13.2 — ABNORMAL HIGH
WBC: 9.1

## 2011-06-16 LAB — URINALYSIS, ROUTINE W REFLEX MICROSCOPIC
Glucose, UA: NEGATIVE
Hgb urine dipstick: NEGATIVE
Ketones, ur: NEGATIVE
Protein, ur: NEGATIVE
Specific Gravity, Urine: 1.019
Urobilinogen, UA: 0.2
pH: 6

## 2011-06-16 LAB — LIPID PANEL
LDL Cholesterol: 114 — ABNORMAL HIGH
Total CHOL/HDL Ratio: 3
VLDL: 13

## 2011-06-16 LAB — I-STAT 8, (EC8 V) (CONVERTED LAB)
Acid-base deficit: 4 — ABNORMAL HIGH
BUN: 12
Chloride: 109
HCT: 50 — ABNORMAL HIGH
Operator id: 198171
Potassium: 3.7
pH, Ven: 7.369 — ABNORMAL HIGH

## 2011-06-16 LAB — BASIC METABOLIC PANEL
BUN: 11
BUN: 8
Chloride: 106
Chloride: 108
Creatinine, Ser: 0.77
GFR calc Af Amer: >60
GFR calc Af Amer: >60
GFR calc non Af Amer: >60
GFR calc non Af Amer: >60
Potassium: 3 — ABNORMAL LOW
Potassium: 3.7
Sodium: 140

## 2011-06-16 LAB — HOMOCYSTEINE: Homocysteine: 7.1

## 2011-06-16 LAB — HEPARIN LEVEL (UNFRACTIONATED)
Heparin Unfractionated: 0.21 — ABNORMAL LOW
Heparin Unfractionated: 0.29 — ABNORMAL LOW
Heparin Unfractionated: 0.38
Heparin Unfractionated: 0.96 — ABNORMAL HIGH

## 2011-06-16 LAB — CARDIAC PANEL(CRET KIN+CKTOT+MB+TROPI)
CK, MB: 26.3 — ABNORMAL HIGH
CK, MB: 7.7 — ABNORMAL HIGH
Total CK: 216 — ABNORMAL HIGH
Total CK: 494 — ABNORMAL HIGH
Total CK: 740 — ABNORMAL HIGH
Troponin I: 0.24 — ABNORMAL HIGH
Troponin I: 2.15

## 2011-06-16 LAB — HEMOGLOBIN A1C

## 2011-06-16 LAB — HEPATIC FUNCTION PANEL
ALT: 16
AST: 37
Alkaline Phosphatase: 94
Bilirubin, Direct: <0.1

## 2011-06-16 LAB — TSH: TSH: 0.007 — ABNORMAL LOW

## 2011-06-16 LAB — ETHANOL: Alcohol, Ethyl (B): <5

## 2011-06-16 LAB — APTT: aPTT: 27

## 2011-06-16 LAB — POCT I-STAT CREATININE: Operator id: 198171

## 2011-06-16 LAB — PROTIME-INR: INR: 0.9

## 2011-08-28 ENCOUNTER — Other Ambulatory Visit: Payer: Self-pay | Admitting: Nephrology

## 2011-08-28 ENCOUNTER — Ambulatory Visit
Admission: RE | Admit: 2011-08-28 | Discharge: 2011-08-28 | Disposition: A | Discharge status: Home / Self Care | Payer: Medicare Other | Source: Ambulatory Visit | Attending: Nephrology | Admitting: Nephrology

## 2011-08-28 DIAGNOSIS — Z87891 Personal history of nicotine dependence: Secondary | ICD-10-CM

## 2012-04-22 ENCOUNTER — Other Ambulatory Visit: Payer: Self-pay | Admitting: Nephrology

## 2012-04-22 DIAGNOSIS — Z1231 Encounter for screening mammogram for malignant neoplasm of breast: Secondary | ICD-10-CM

## 2012-05-07 ENCOUNTER — Ambulatory Visit
Admission: RE | Admit: 2012-05-07 | Discharge: 2012-05-07 | Disposition: A | Discharge status: Home / Self Care | Payer: Medicare Other | Source: Ambulatory Visit | Attending: Nephrology | Admitting: Nephrology

## 2012-05-07 DIAGNOSIS — Z1231 Encounter for screening mammogram for malignant neoplasm of breast: Secondary | ICD-10-CM

## 2013-05-05 ENCOUNTER — Other Ambulatory Visit: Payer: Self-pay

## 2013-05-05 DIAGNOSIS — Z1231 Encounter for screening mammogram for malignant neoplasm of breast: Secondary | ICD-10-CM

## 2013-05-31 ENCOUNTER — Ambulatory Visit
Admission: RE | Admit: 2013-05-31 | Discharge: 2013-05-31 | Disposition: A | Discharge status: Home / Self Care | Payer: Self-pay | Source: Ambulatory Visit

## 2013-05-31 DIAGNOSIS — Z1231 Encounter for screening mammogram for malignant neoplasm of breast: Secondary | ICD-10-CM

## 2013-06-02 ENCOUNTER — Other Ambulatory Visit: Payer: Self-pay | Admitting: Internal Medicine

## 2013-06-02 DIAGNOSIS — R928 Other abnormal and inconclusive findings on diagnostic imaging of breast: Secondary | ICD-10-CM

## 2013-06-16 ENCOUNTER — Ambulatory Visit
Admission: RE | Admit: 2013-06-16 | Discharge: 2013-06-16 | Disposition: A | Discharge status: Home / Self Care | Payer: Self-pay | Source: Ambulatory Visit | Attending: Internal Medicine | Admitting: Internal Medicine

## 2013-06-16 DIAGNOSIS — R928 Other abnormal and inconclusive findings on diagnostic imaging of breast: Secondary | ICD-10-CM

## 2013-08-17 ENCOUNTER — Other Ambulatory Visit: Payer: Self-pay | Admitting: Internal Medicine

## 2013-08-17 DIAGNOSIS — Z780 Asymptomatic menopausal state: Secondary | ICD-10-CM

## 2013-09-20 ENCOUNTER — Other Ambulatory Visit: Payer: Medicare Other

## 2013-10-07 ENCOUNTER — Ambulatory Visit
Admission: RE | Admit: 2013-10-07 | Discharge: 2013-10-07 | Disposition: A | Discharge status: Home / Self Care | Payer: Medicare HMO | Source: Ambulatory Visit | Attending: Internal Medicine | Admitting: Internal Medicine

## 2013-10-07 DIAGNOSIS — Z780 Asymptomatic menopausal state: Secondary | ICD-10-CM

## 2013-11-24 ENCOUNTER — Ambulatory Visit
Admission: RE | Admit: 2013-11-24 | Discharge: 2013-11-24 | Disposition: A | Discharge status: Home / Self Care | Payer: Commercial Managed Care - HMO | Source: Ambulatory Visit | Attending: Internal Medicine | Admitting: Internal Medicine

## 2014-02-20 ENCOUNTER — Ambulatory Visit
Admission: RE | Admit: 2014-02-20 | Discharge: 2014-02-20 | Disposition: A | Discharge status: Home / Self Care | Payer: Commercial Managed Care - HMO | Source: Ambulatory Visit | Attending: Internal Medicine | Admitting: Internal Medicine

## 2014-02-20 ENCOUNTER — Other Ambulatory Visit: Payer: Self-pay | Admitting: Internal Medicine

## 2014-02-20 DIAGNOSIS — M25512 Pain in left shoulder: Secondary | ICD-10-CM

## 2014-02-20 DIAGNOSIS — M542 Cervicalgia: Secondary | ICD-10-CM

## 2014-06-16 ENCOUNTER — Other Ambulatory Visit: Payer: Self-pay

## 2014-06-16 DIAGNOSIS — Z1231 Encounter for screening mammogram for malignant neoplasm of breast: Secondary | ICD-10-CM

## 2014-06-26 ENCOUNTER — Ambulatory Visit
Admission: RE | Admit: 2014-06-26 | Discharge: 2014-06-26 | Disposition: A | Discharge status: Home / Self Care | Payer: Commercial Managed Care - HMO | Source: Ambulatory Visit

## 2014-06-26 DIAGNOSIS — Z1231 Encounter for screening mammogram for malignant neoplasm of breast: Secondary | ICD-10-CM

## 2014-08-21 ENCOUNTER — Other Ambulatory Visit: Payer: Self-pay | Admitting: Internal Medicine

## 2014-08-21 ENCOUNTER — Ambulatory Visit
Admission: RE | Admit: 2014-08-21 | Discharge: 2014-08-21 | Disposition: A | Discharge status: Home / Self Care | Payer: Commercial Managed Care - HMO | Source: Ambulatory Visit | Attending: Internal Medicine | Admitting: Internal Medicine

## 2014-08-21 DIAGNOSIS — R05 Cough: Secondary | ICD-10-CM

## 2014-08-21 DIAGNOSIS — R059 Cough, unspecified: Secondary | ICD-10-CM

## 2015-02-03 IMAGING — MG MM SCREEN MAMMOGRAM BILATERAL
4 series · 4 of 4 positions shown · non-contrast
Comparison: Previous exam(s).

ACR Breast Density Category a: The breast tissue is almost entirely
fatty.

CLINICAL DATA: Screening.

EXAM:
DIGITAL SCREENING BILATERAL MAMMOGRAM WITH CAD

[R CC]
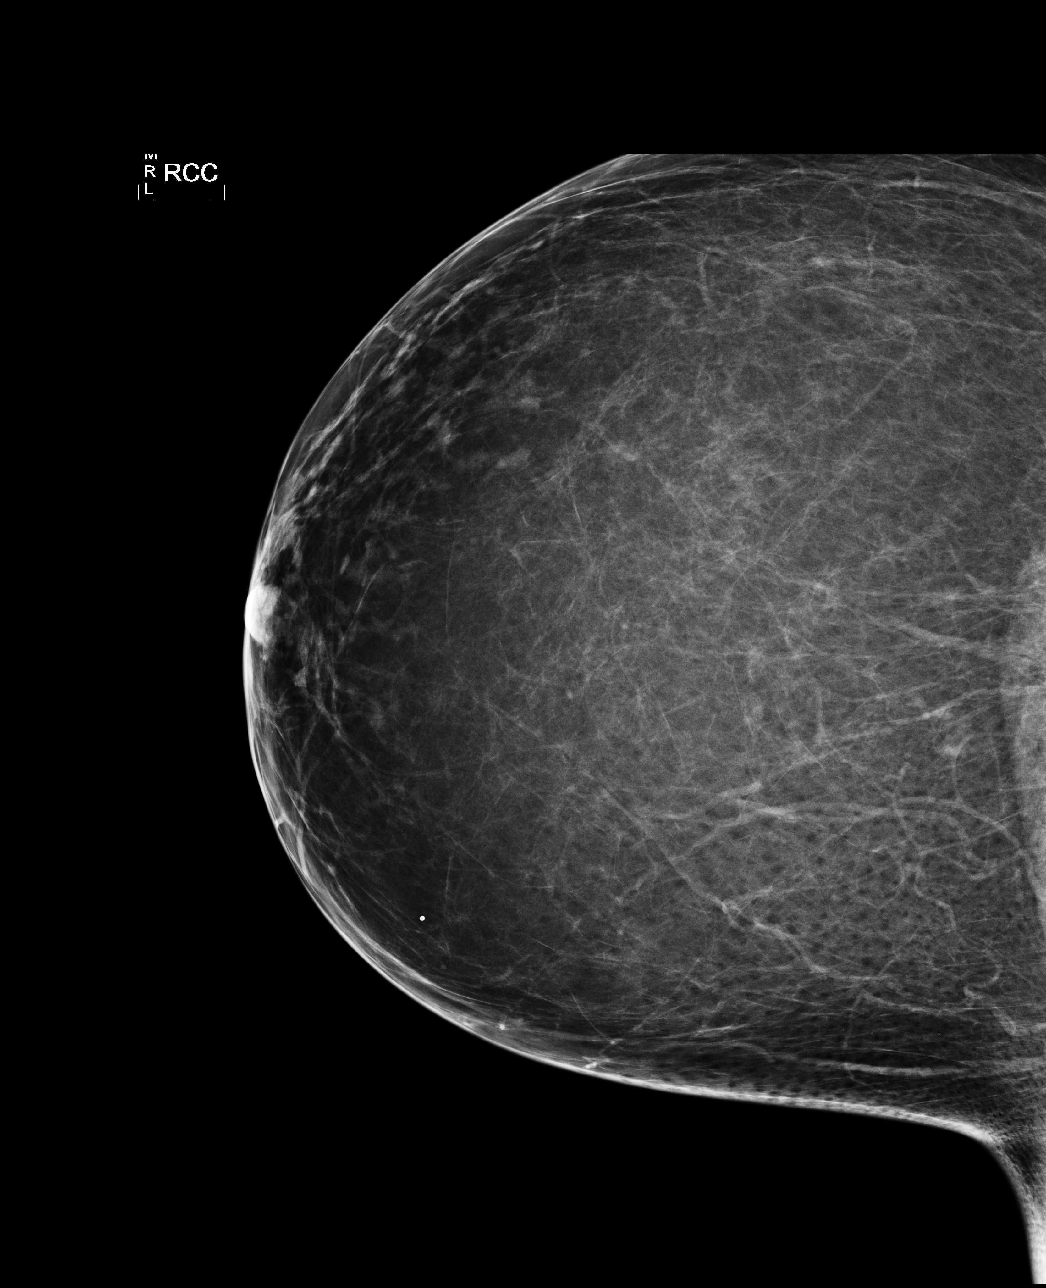

[L CC]
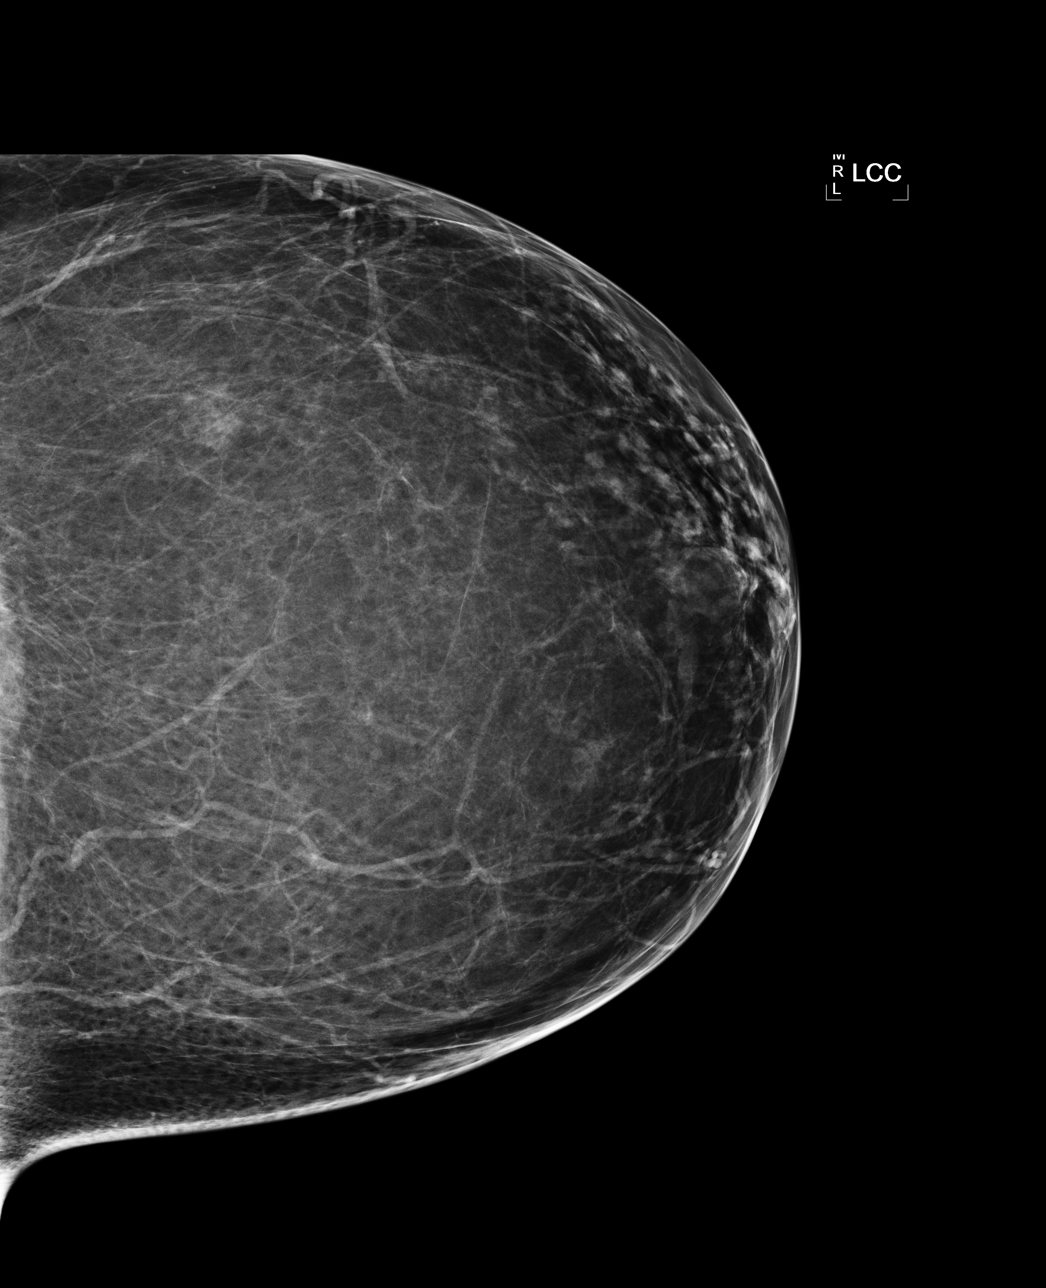

[L MLO]
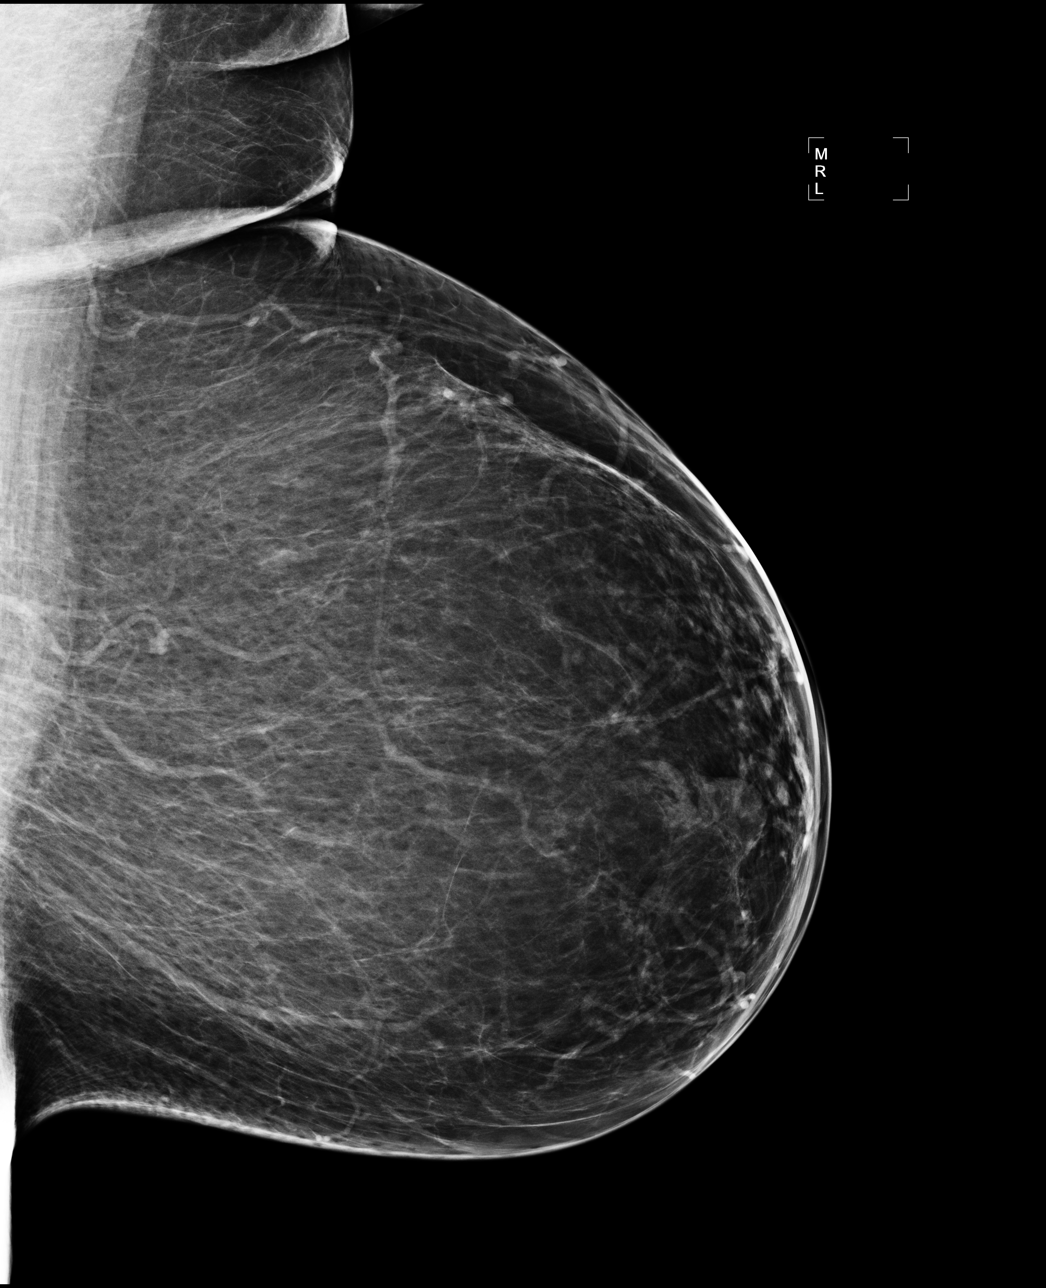

[R MLO]
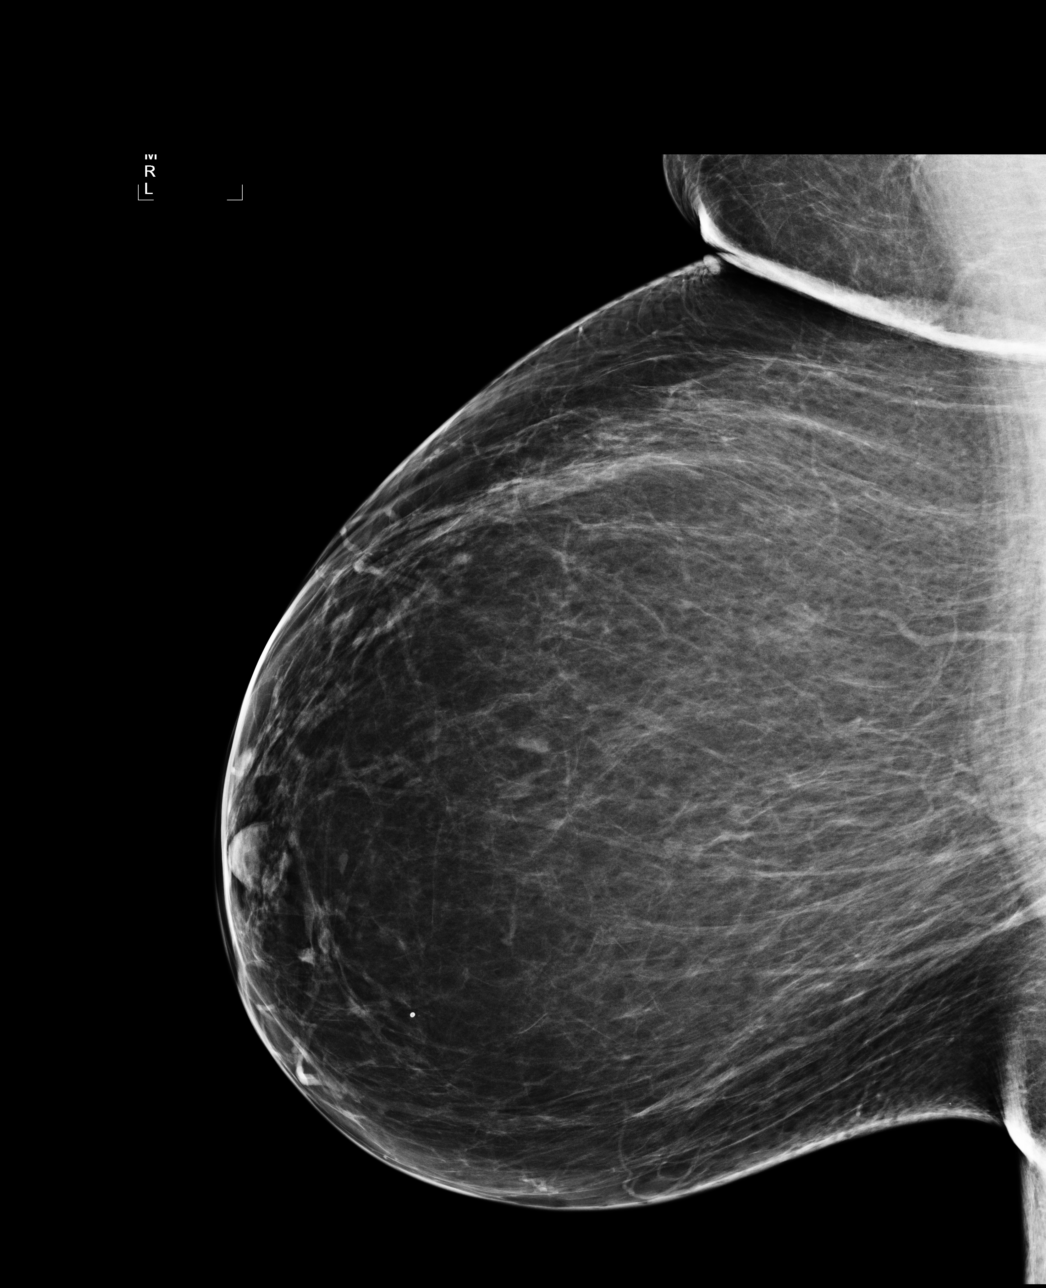

[4 of 4 positions shown; findings below may reference images not displayed]

FINDINGS: In the left breast, a possible asymmetry warrants further evaluation
with spot compression views and possibly ultrasound. In the right
breast, no suspicious masses or malignant type calcifications are
identified. Images were processed with CAD.
IMPRESSION: Further evaluation is suggested for possible asymmetry in the left
breast.

RECOMMENDATION:
Diagnostic mammogram and possibly ultrasound of the left breast.
(Code:6I-A-22F)

The patient will be contacted regarding the findings, and additional
imaging will be scheduled.

BI-RADS CATEGORY  0: Incomplete. Need additional imaging evaluation
and/or prior mammograms for comparison.

## 2015-05-25 ENCOUNTER — Other Ambulatory Visit: Payer: Self-pay

## 2015-05-25 DIAGNOSIS — Z1231 Encounter for screening mammogram for malignant neoplasm of breast: Secondary | ICD-10-CM

## 2015-06-29 ENCOUNTER — Ambulatory Visit
Admission: RE | Admit: 2015-06-29 | Discharge: 2015-06-29 | Disposition: A | Discharge status: Home / Self Care | Payer: Medicare HMO | Source: Ambulatory Visit

## 2015-06-29 DIAGNOSIS — Z1231 Encounter for screening mammogram for malignant neoplasm of breast: Secondary | ICD-10-CM

## 2016-06-02 ENCOUNTER — Other Ambulatory Visit: Payer: Self-pay | Admitting: Internal Medicine

## 2016-06-02 DIAGNOSIS — Z1231 Encounter for screening mammogram for malignant neoplasm of breast: Secondary | ICD-10-CM

## 2016-06-30 ENCOUNTER — Ambulatory Visit
Admission: RE | Admit: 2016-06-30 | Discharge: 2016-06-30 | Disposition: A | Discharge status: Home / Self Care | Payer: Medicare HMO | Source: Ambulatory Visit | Attending: Internal Medicine | Admitting: Internal Medicine

## 2016-06-30 DIAGNOSIS — Z1231 Encounter for screening mammogram for malignant neoplasm of breast: Secondary | ICD-10-CM

## 2017-06-02 ENCOUNTER — Other Ambulatory Visit: Payer: Self-pay | Admitting: Internal Medicine

## 2017-06-02 DIAGNOSIS — Z1231 Encounter for screening mammogram for malignant neoplasm of breast: Secondary | ICD-10-CM

## 2017-07-02 ENCOUNTER — Ambulatory Visit
Admission: RE | Admit: 2017-07-02 | Discharge: 2017-07-02 | Disposition: A | Discharge status: Home / Self Care | Payer: Medicare HMO | Source: Ambulatory Visit | Attending: Internal Medicine | Admitting: Internal Medicine

## 2017-07-02 DIAGNOSIS — Z1231 Encounter for screening mammogram for malignant neoplasm of breast: Secondary | ICD-10-CM

## 2018-02-06 ENCOUNTER — Observation Stay (HOSPITAL_COMMUNITY): Payer: Medicare HMO

## 2018-02-06 ENCOUNTER — Encounter (HOSPITAL_COMMUNITY): Payer: Self-pay

## 2018-02-06 ENCOUNTER — Other Ambulatory Visit: Payer: Self-pay

## 2018-02-06 ENCOUNTER — Ambulatory Visit (HOSPITAL_COMMUNITY)
Admission: EM | Admit: 2018-02-06 | Discharge: 2018-02-06 | Disposition: A | Discharge status: Home / Self Care | Payer: Medicare HMO | Attending: Internal Medicine | Admitting: Internal Medicine

## 2018-02-06 ENCOUNTER — Inpatient Hospital Stay (HOSPITAL_COMMUNITY)
Admission: EM | Admit: 2018-02-06 | Discharge: 2018-02-11 | DRG: 603 | Disposition: A | Discharge status: Home / Self Care | Payer: Medicare HMO | Attending: Internal Medicine | Admitting: Internal Medicine

## 2018-02-06 ENCOUNTER — Encounter (HOSPITAL_COMMUNITY): Payer: Self-pay | Admitting: *Deleted

## 2018-02-06 DIAGNOSIS — L0390 Cellulitis, unspecified: Secondary | ICD-10-CM | POA: Diagnosis present

## 2018-02-06 DIAGNOSIS — E039 Hypothyroidism, unspecified: Secondary | ICD-10-CM | POA: Diagnosis present

## 2018-02-06 DIAGNOSIS — Z79899 Other long term (current) drug therapy: Secondary | ICD-10-CM

## 2018-02-06 DIAGNOSIS — Z7902 Long term (current) use of antithrombotics/antiplatelets: Secondary | ICD-10-CM

## 2018-02-06 DIAGNOSIS — L03115 Cellulitis of right lower limb: Secondary | ICD-10-CM

## 2018-02-06 DIAGNOSIS — Z96653 Presence of artificial knee joint, bilateral: Secondary | ICD-10-CM

## 2018-02-06 DIAGNOSIS — D638 Anemia in other chronic diseases classified elsewhere: Secondary | ICD-10-CM | POA: Diagnosis present

## 2018-02-06 DIAGNOSIS — A46 Erysipelas: Secondary | ICD-10-CM | POA: Diagnosis present

## 2018-02-06 DIAGNOSIS — I10 Essential (primary) hypertension: Secondary | ICD-10-CM | POA: Diagnosis present

## 2018-02-06 DIAGNOSIS — Z8673 Personal history of transient ischemic attack (TIA), and cerebral infarction without residual deficits: Secondary | ICD-10-CM

## 2018-02-06 DIAGNOSIS — E079 Disorder of thyroid, unspecified: Secondary | ICD-10-CM | POA: Diagnosis present

## 2018-02-06 DIAGNOSIS — E785 Hyperlipidemia, unspecified: Secondary | ICD-10-CM | POA: Diagnosis present

## 2018-02-06 DIAGNOSIS — B955 Unspecified streptococcus as the cause of diseases classified elsewhere: Secondary | ICD-10-CM | POA: Diagnosis present

## 2018-02-06 DIAGNOSIS — Z87891 Personal history of nicotine dependence: Secondary | ICD-10-CM

## 2018-02-06 DIAGNOSIS — W57XXXA Bitten or stung by nonvenomous insect and other nonvenomous arthropods, initial encounter: Secondary | ICD-10-CM | POA: Diagnosis present

## 2018-02-06 DIAGNOSIS — Z6830 Body mass index (BMI) 30.0-30.9, adult: Secondary | ICD-10-CM

## 2018-02-06 DIAGNOSIS — R509 Fever, unspecified: Secondary | ICD-10-CM | POA: Diagnosis present

## 2018-02-06 DIAGNOSIS — E876 Hypokalemia: Secondary | ICD-10-CM | POA: Diagnosis not present

## 2018-02-06 DIAGNOSIS — E669 Obesity, unspecified: Secondary | ICD-10-CM | POA: Diagnosis present

## 2018-02-06 HISTORY — DX: Essential (primary) hypertension: I10

## 2018-02-06 HISTORY — DX: Disorder of thyroid, unspecified: E07.9

## 2018-02-06 HISTORY — DX: Hyperlipidemia, unspecified: E78.5

## 2018-02-06 LAB — COMPREHENSIVE METABOLIC PANEL
ALT: 21 U/L (ref 14–54)
ANION GAP: 10 (ref 5–15)
AST: 39 U/L (ref 15–41)
Albumin: 3.8 g/dL (ref 3.5–5.0)
Alkaline Phosphatase: 75 U/L (ref 38–126)
BUN: 15 mg/dL (ref 6–20)
CO2: 22 mmol/L (ref 22–32)
CREATININE: 1.1 mg/dL — AB (ref 0.44–1.00)
Calcium: 9.3 mg/dL (ref 8.9–10.3)
Chloride: 107 mmol/L (ref 101–111)
GFR calc non Af Amer: 48 mL/min — ABNORMAL LOW (ref >60)
GFR, EST AFRICAN AMERICAN: 56 mL/min — AB (ref >60)
Glucose, Bld: 115 mg/dL — ABNORMAL HIGH (ref 65–99)
Potassium: 3.6 mmol/L (ref 3.5–5.1)
Sodium: 139 mmol/L (ref 135–145)
TOTAL PROTEIN: 7.3 g/dL (ref 6.5–8.1)
Total Bilirubin: 0.4 mg/dL (ref 0.3–1.2)

## 2018-02-06 LAB — URINALYSIS, ROUTINE W REFLEX MICROSCOPIC
BILIRUBIN URINE: NEGATIVE
GLUCOSE, UA: NEGATIVE mg/dL
Ketones, ur: NEGATIVE mg/dL
Leukocytes, UA: NEGATIVE
NITRITE: NEGATIVE
Protein, ur: NEGATIVE mg/dL
SPECIFIC GRAVITY, URINE: 1.01 (ref 1.005–1.030)
pH: 5 (ref 5.0–8.0)

## 2018-02-06 LAB — CBC WITH DIFFERENTIAL/PLATELET
ABS IMMATURE GRANULOCYTES: 0.1 10*3/uL (ref 0.0–0.1)
BASOS PCT: 0 %
Basophils Absolute: 0 10*3/uL (ref 0.0–0.1)
Eosinophils Absolute: 0.1 10*3/uL (ref 0.0–0.7)
Eosinophils Relative: 1 %
HCT: 37.7 % (ref 36.0–46.0)
Hemoglobin: 12.4 g/dL (ref 12.0–15.0)
IMMATURE GRANULOCYTES: 0 %
Lymphocytes Relative: 11 %
Lymphs Abs: 1.6 10*3/uL (ref 0.7–4.0)
MCH: 25.4 pg — ABNORMAL LOW (ref 26.0–34.0)
MCHC: 32.9 g/dL (ref 30.0–36.0)
MCV: 77.3 fL — ABNORMAL LOW (ref 78.0–100.0)
Monocytes Absolute: 1 10*3/uL (ref 0.1–1.0)
Monocytes Relative: 7 %
NEUTROS ABS: 11.5 10*3/uL — AB (ref 1.7–7.7)
Neutrophils Relative %: 81 %
PLATELETS: 284 10*3/uL (ref 150–400)
RBC: 4.88 MIL/uL (ref 3.87–5.11)
RDW: 16.6 % — AB (ref 11.5–15.5)
WBC: 14.3 10*3/uL — ABNORMAL HIGH (ref 4.0–10.5)

## 2018-02-06 LAB — I-STAT CG4 LACTIC ACID, ED
LACTIC ACID, VENOUS: 1.67 mmol/L (ref 0.5–1.9)
Lactic Acid, Venous: 1.05 mmol/L (ref 0.5–1.9)

## 2018-02-06 MED ORDER — LEVOTHYROXINE SODIUM 50 MCG PO TABS: 50.0000 ug | ORAL_TABLET | ORAL | Status: DC

## 2018-02-06 MED ORDER — LISINOPRIL 5 MG PO TABS
5.0000 mg | ORAL_TABLET | Freq: Every day | ORAL | Status: DC
  Administered 2018-02-07 – 2018-02-10 (×3): 5 mg via ORAL
  Filled 2018-02-06 (×4): qty 1

## 2018-02-06 MED ORDER — CLOPIDOGREL BISULFATE 75 MG PO TABS
75.0000 mg | ORAL_TABLET | Freq: Every day | ORAL | Status: DC
  Administered 2018-02-07 – 2018-02-11 (×5): 75 mg via ORAL
  Filled 2018-02-06 (×5): qty 1

## 2018-02-06 MED ORDER — SODIUM CHLORIDE 0.9 % IV SOLN: 250.0000 mL | INTRAVENOUS | Status: DC | PRN

## 2018-02-06 MED ORDER — SODIUM CHLORIDE 0.9% FLUSH
3.0000 mL | Freq: Two times a day (BID) | INTRAVENOUS | Status: DC
  Administered 2018-02-07 – 2018-02-11 (×7): 3 mL via INTRAVENOUS

## 2018-02-06 MED ORDER — ENOXAPARIN SODIUM 40 MG/0.4ML ~~LOC~~ SOLN
40.0000 mg | SUBCUTANEOUS | Status: DC
  Administered 2018-02-06 – 2018-02-10 (×5): 40 mg via SUBCUTANEOUS
  Filled 2018-02-06 (×5): qty 0.4

## 2018-02-06 MED ORDER — AMLODIPINE BESYLATE 5 MG PO TABS
5.0000 mg | ORAL_TABLET | Freq: Every day | ORAL | Status: DC
  Administered 2018-02-06 – 2018-02-07 (×2): 5 mg via ORAL
  Filled 2018-02-06 (×2): qty 1

## 2018-02-06 MED ORDER — LEVOTHYROXINE SODIUM 88 MCG PO TABS
88.0000 ug | ORAL_TABLET | ORAL | Status: DC
  Administered 2018-02-07 – 2018-02-11 (×3): 88 ug via ORAL
  Filled 2018-02-06 (×3): qty 1

## 2018-02-06 MED ORDER — SODIUM CHLORIDE 0.9 % IV SOLN
1.0000 g | INTRAVENOUS | Status: DC
  Administered 2018-02-06 – 2018-02-09 (×4): 1 g via INTRAVENOUS
  Filled 2018-02-06 (×4): qty 10

## 2018-02-06 MED ORDER — ATORVASTATIN CALCIUM 10 MG PO TABS
10.0000 mg | ORAL_TABLET | Freq: Every day | ORAL | Status: DC
  Administered 2018-02-07 – 2018-02-10 (×4): 10 mg via ORAL
  Filled 2018-02-06 (×4): qty 1

## 2018-02-06 MED ORDER — TRAMADOL HCL 50 MG PO TABS
50.0000 mg | ORAL_TABLET | Freq: Four times a day (QID) | ORAL | Status: DC | PRN
  Administered 2018-02-06 – 2018-02-10 (×5): 50 mg via ORAL
  Filled 2018-02-06 (×5): qty 1

## 2018-02-06 MED ORDER — DIAZEPAM 5 MG PO TABS: 5.0000 mg | ORAL_TABLET | Freq: Two times a day (BID) | ORAL | Status: DC | PRN

## 2018-02-06 MED ORDER — ACETAMINOPHEN 650 MG RE SUPP: 650.0000 mg | Freq: Four times a day (QID) | RECTAL | Status: DC | PRN

## 2018-02-06 MED ORDER — ACETAMINOPHEN 325 MG PO TABS
650.0000 mg | ORAL_TABLET | Freq: Once | ORAL | Status: AC | PRN
  Administered 2018-02-06: 650 mg via ORAL
  Filled 2018-02-06: qty 2

## 2018-02-06 MED ORDER — VANCOMYCIN HCL 10 G IV SOLR
1250.0000 mg | INTRAVENOUS | Status: DC
  Administered 2018-02-06 – 2018-02-07 (×2): 1250 mg via INTRAVENOUS
  Filled 2018-02-06 (×3): qty 1250

## 2018-02-06 MED ORDER — SODIUM CHLORIDE 0.9% FLUSH: 3.0000 mL | INTRAVENOUS | Status: DC | PRN

## 2018-02-06 MED ORDER — SODIUM CHLORIDE 0.9 % IV BOLUS
1000.0000 mL | Freq: Once | INTRAVENOUS | Status: AC
  Administered 2018-02-06: 1000 mL via INTRAVENOUS

## 2018-02-06 MED ORDER — ACETAMINOPHEN 325 MG PO TABS
650.0000 mg | ORAL_TABLET | Freq: Four times a day (QID) | ORAL | Status: DC | PRN
  Administered 2018-02-07 – 2018-02-08 (×2): 650 mg via ORAL
  Filled 2018-02-06 (×2): qty 2

## 2018-02-06 MED ORDER — CLINDAMYCIN PHOSPHATE 600 MG/50ML IV SOLN
600.0000 mg | Freq: Once | INTRAVENOUS | Status: AC
  Administered 2018-02-06: 600 mg via INTRAVENOUS
  Filled 2018-02-06: qty 50

## 2018-02-06 NOTE — ED Provider Notes (Addendum)
[facility]    CSN: [id number] Arrival date & time: 02/06/18  1159     History   Chief Complaint Chief Complaint  Patient presents with  . Insect Bite    HPI Stacey Lambert is a 72 y.o. female.   72 y.o. female presents with right lower leg eythema, swelling and warmth  X wednesday. Patient states that she thinks she was bitten by a spider on Wednesday while gardening. Condition is persistent and worseing in nature. Condition is made better by nothing. Condition is made worse by movement. Patient denies any treatment prior to there arrival at this facility. Patient endorses fever as high as 104. Patient states that initially she had pain on Wednesday with the cellulutic looking rash appearing on Thursday. Patient was given the choice of prescribing PO antibiotics and skin marking with return visit in 24 h or treatment at ED for possible IV antibiotics. Patient elected ED.       History reviewed. No pertinent past medical history.  There are no active problems to display for this patient.   History reviewed. No pertinent surgical history.  OB History   None      Home Medications    Prior to Admission medications   Medication Sig Start Date End Date Taking? Authorizing Provider  diazepam (VALIUM) 5 MG tablet TK 1 T PO  BID PRN 11/30/17   [provider]    Family History Family History  Problem Relation Age of Onset  . Breast cancer Mother     Social History Social History   Tobacco Use  . Smoking status: Current Every Day Smoker  . Smokeless tobacco: Never Used  Substance Use Topics  . Alcohol use: Not on file  . Drug use: Not on file     Allergies   Patient has no known allergies.   Review of Systems Review of Systems  Constitutional: Positive for fever ( 104). Negative for chills.  HENT: Negative for ear pain and sore throat.   Eyes: Negative for pain and visual disturbance.  Respiratory: Negative for cough and shortness of  breath.   Cardiovascular: Negative for chest pain and palpitations.  Gastrointestinal: Negative for abdominal pain and vomiting.  Genitourinary: Negative for dysuria and hematuria.  Musculoskeletal: Negative for arthralgias and back pain.  Skin: Positive for color change ( swelling to right lower leg). Negative for rash.  Neurological: Negative for seizures and syncope.  All other systems reviewed and are negative.    Physical Exam Triage Vital Signs ED Triage Vitals [02/06/18 1227]  Enc Vitals Group     BP (!) 166/76     Pulse Rate 83     Resp      Temp 99.8 F (37.7 C)     Temp Source Oral     SpO2 98 %     Weight      Height      Head Circumference      Peak Flow      Pain Score      Pain Loc      Pain Edu?      Excl. in GC?    No data found.  Updated Vital Signs BP (!) 166/76 (BP Location: Right Arm)   Pulse 83   Temp 99.8 F (37.7 C) (Oral)   SpO2 98%   Visual Acuity Right Eye Distance:   Left Eye Distance:   Bilateral Distance:    Right Eye Near:   Left Eye Near:  Bilateral Near:     Physical Exam  Constitutional: She is oriented to person, place, and time. She appears well-developed and well-nourished.  HENT:  Head: Normocephalic and atraumatic.  Eyes: Conjunctivae are normal.  Neck: Normal range of motion.  Pulmonary/Chest: Effort normal.  Neurological: She is alert and oriented to person, place, and time.  Skin: Skin is warm. There is erythema.  And swelling noted to right lower leg. Erythema circumferenial in nature.   Psychiatric: She has a normal mood and affect.  Nursing note and vitals reviewed.    UC Treatments / Results  Labs (all labs ordered are listed, but only abnormal results are displayed) Labs Reviewed - No data to display  EKG None  Radiology No results found.  Procedures Procedures (including critical care time)  Medications Ordered in UC Medications - No data to display  Initial Impression / Assessment and  Plan / UC Course  I have reviewed the triage vital signs and the nursing notes.  Pertinent labs & imaging results that were available during my care of the patient were reviewed by me and considered in my medical decision making (see chart for details).      Final Clinical Impressions(s) / UC Diagnoses   Final diagnoses:  None   Discharge Instructions   None    ED Prescriptions    None     Controlled Substance Prescriptions [place] Controlled Substance Registry consulted? Not Applicable   Alene Mires, NP 02/06/18 1234    Alene Mires, NP 02/06/18 [phone]

## 2018-02-06 NOTE — ED Notes (Signed)
Pt stats area of redness ahas advanced at right LE.

## 2018-02-06 NOTE — ED Triage Notes (Signed)
Pt states that she had been doing yard work on Wednesday and Thursday she noticed that she did not feel like herself. By Friday morning she said she could not get going and realized that she had redness around her right ankle with flu like symptoms.

## 2018-02-06 NOTE — ED Triage Notes (Signed)
Started Wednesday and on her lower right leg, goes around the entire ankle,

## 2018-02-06 NOTE — ED Notes (Signed)
500 cc bolus complete for part of fluid bolus; 500 cc bag hung to complete 1L.

## 2018-02-06 NOTE — ED Notes (Signed)
Pt. Stated her temp is still slightly elevated after being given medicine earlier

## 2018-02-06 NOTE — Progress Notes (Signed)
Pharmacy Antibiotic Note  Stacey Lambert is a 72 y.o. female admitted on 02/06/2018 with cellulitis.  Pharmacy has been consulted for vancomycin dosing.  SCr 1.1- no other recent labs on file or in CareEverywhere, so uncertain of baseline.  Plan: Vancomycin 1250mg  IV q24h Ceftriaxone 1g IV q24h Follow c/s, clinical progression, renal function, level PRN, de-escalation  Height: 5\' 6"  (167.6 cm) Weight: 173 lb 4.5 oz (78.6 kg) IBW/kg (Calculated) : 59.3  Temp (24hrs), Avg:99.7 F (37.6 C), Min:98.3 F (36.8 C), Max:100.3 F (37.9 C)  Recent Labs  Lab 02/06/18 1339 02/06/18 1357 02/06/18 1836  WBC 14.3*  --   --   CREATININE 1.10*  --   --   LATICACIDVEN  --  1.05 1.67    Estimated Creatinine Clearance: 47.5 mL/min (A) (by C-G formula based on SCr of 1.1 mg/dL (H)).    No Known Allergies  Antimicrobials this admission: Vancomycin 6/1>> Ceftriaxone 6/1>>  Dose adjustments this admission: n/a  Microbiology results: 6/1 BCx:    Thank you for allowing pharmacy to be a part of this patient's care.  Quinesha Selinger D. Karl Knarr, PharmD, BCPS Clinical Pharmacist [phone] Please check AMION for all [facility] 02/06/2018 9:07 PM

## 2018-02-06 NOTE — Progress Notes (Signed)
Received pt from ED. Alert and oriented x4. Complained of right leg pain 8/10, PRN Tramadol given. Noted  right lower leg with  redness and  swelling. Oriented to room and call bell.

## 2018-02-06 NOTE — ED Provider Notes (Signed)
[facility]   CSN: [id number] Arrival date & time: 02/06/18  1236     History   Chief Complaint Chief Complaint  Patient presents with  . Leg Swelling    HPI Stacey Lambert is a 72 y.o. female.  Patient is a 72 year old female with a history of hypertension, hyperlipidemia and thyroid disease presenting today with 4 days of worsening erythema and pain to the right lower extremity.  Patient states that prior to symptoms starting she thought she may have been bit by something in her garden.  The next day she developed a fever of 104 and redness that is just progressively gotten worse.  Patient denies any chest pain, shortness of breath, syncope, abdominal pain, nausea or vomiting.  Patient was seen at urgent care and sent here for further evaluation.  Earlier today patient was febrile to 100.3.  The history is provided by the patient.    Past Medical History:  Diagnosis Date  . Hyperlipidemia   . Hypertension   . Thyroid disease     There are no active problems to display for this patient.   Past Surgical History:  Procedure Laterality Date  . ABDOMINAL HYSTERECTOMY       OB History   None      Home Medications    Prior to Admission medications   Medication Sig Start Date End Date Taking? Authorizing Provider  diazepam (VALIUM) 5 MG tablet TK 1 T PO  BID PRN 11/30/17   [provider]    Family History Family History  Problem Relation Age of Onset  . Breast cancer Mother     Social History Social History   Tobacco Use  . Smoking status: Current Every Day Smoker  . Smokeless tobacco: Never Used  Substance Use Topics  . Alcohol use: Never    Frequency: Never  . Drug use: Never     Allergies   Patient has no known allergies.   Review of Systems Review of Systems  All other systems reviewed and are negative.    Physical Exam Updated Vital Signs BP 111/65 (BP Location: Right Arm)    Pulse 61   Temp 98.3 F (36.8 C) (Oral)   Resp 18   Ht 5\' 6"  (1.676 m)   Wt 80.7 kg (178 lb)   SpO2 99%   BMI 28.73 kg/m   Physical Exam  Constitutional: She is oriented to person, place, and time. She appears well-developed and well-nourished. No distress.  HENT:  Head: Normocephalic and atraumatic.  Mouth/Throat: Oropharynx is clear and moist.  Eyes: Pupils are equal, round, and reactive to light. Conjunctivae and EOM are normal.  Neck: Normal range of motion. Neck supple.  Cardiovascular: Normal rate, regular rhythm and intact distal pulses.  No murmur heard. Pulmonary/Chest: Effort normal and breath sounds normal. No respiratory distress. She has no wheezes. She has no rales.  Abdominal: Soft. She exhibits no distension. There is no tenderness. There is no rebound and no guarding.  Musculoskeletal: Normal range of motion. She exhibits tenderness. She exhibits no edema.       Legs: Neurological: She is alert and oriented to person, place, and time.  Skin: Skin is warm and dry. No rash noted. No erythema.  Psychiatric: She has a normal mood and affect. Her behavior is normal.  Nursing note and vitals reviewed.    ED Treatments / Results  Labs (all labs ordered are listed, but only abnormal results are displayed)  Labs Reviewed  COMPREHENSIVE METABOLIC PANEL - Abnormal; Notable for the following components:      Result Value   Glucose, Bld 115 (*)    Creatinine, Ser 1.10 (*)    GFR calc non Af Amer 48 (*)    GFR calc Af Amer 56 (*)    All other components within normal limits  CBC WITH DIFFERENTIAL/PLATELET - Abnormal; Notable for the following components:   WBC 14.3 (*)    MCV 77.3 (*)    MCH 25.4 (*)    RDW 16.6 (*)    Neutro Abs 11.5 (*)    All other components within normal limits  I-STAT CG4 LACTIC ACID, ED  I-STAT CG4 LACTIC ACID, ED    EKG None  Radiology No results found.  Procedures Procedures (including critical care time)  Medications Ordered in  ED Medications  sodium chloride 0.9 % bolus 1,000 mL (1,000 mLs Intravenous New Bag/Given 02/06/18 1828)  clindamycin (CLEOCIN) IVPB 600 mg (600 mg Intravenous New Bag/Given 02/06/18 1828)  acetaminophen (TYLENOL) tablet 650 mg (650 mg Oral Given 02/06/18 1336)     Initial Impression / Assessment and Plan / ED Course  I have reviewed the triage vital signs and the nursing notes.  Pertinent labs & imaging results that were available during my care of the patient were reviewed by me and considered in my medical decision making (see chart for details).    Elderly female presenting today with symptoms of cellulitis.  This most likely started after insect bite while she was outside.  Patient is having fever and chills.  Leukocytosis today of 14,000 but normal lactate.  Initial blood pressure was slightly soft at 98/70 but it improved after IV fluids.  Patient started on clindamycin.  Will admit for obs to ensure symptoms are improving prior to discharge  Final Clinical Impressions(s) / ED Diagnoses   Final diagnoses:  Cellulitis of right lower extremity    ED Discharge Orders    None       Gwyneth Sprout, MD 02/06/18 1858

## 2018-02-06 NOTE — ED Notes (Signed)
ED Provider at bedside. 

## 2018-02-06 NOTE — H&P (Signed)
TRH H&P   Patient Demographics:    Stacey Lambert, is a 72 y.o. female  MRN: [id number]   DOB - [birth year]  Admit Date - 02/06/2018  Outpatient Primary MD for the patient is Burton Apleyoberts, Ronald, MD  Referring MD/NP/PA:  Gwyneth SproutWhitney Plunkett  Outpatient Specialists:   Patient coming from: home  Chief Complaint  Patient presents with  . Leg Swelling      HPI:    Stacey PhoJean Fye  is a 72 y.o. female,  w hypertension, hyperlipidemia, apparently c/o bug bite while working outside on Thursday.  Since then has had right lower ext redness from the just above the ankle to about 2/3 up to knee.  Pt denies calf pain.  Pt denies tick bite.  Pt notes slight fever. ED requesting admission for cellulitis  In Ed,  Na 139, K 3.6, Bun 15, Creatinine 1.10 Ast 39, Alt 21 Wbc 14.3, Hgb 12.4, Plt 284  Blood culture x2 pending  Pt will be admitted for cellulitis.     Review of systems:    In addition to the HPI above,   No Headache, No changes with Vision or hearing, No problems swallowing food or Liquids, No Chest pain, Cough or Shortness of Breath, No Abdominal pain, No Nausea or Vommitting, Bowel movements are regular, No Blood in stool or Urine, No dysuria, No new joints pains-aches,  No new weakness, tingling, numbness in any extremity, No recent weight gain or loss, No polyuria, polydypsia or polyphagia, No significant Mental Stressors.  A full 10 point Review of Systems was done, except as stated above, all other Review of Systems were negative.   With Past History of the following :    Past Medical History:  Diagnosis Date  . Hyperlipidemia   . Hypertension   . Thyroid disease       Past Surgical History:  Procedure Laterality Date  . ABDOMINAL HYSTERECTOMY        Social History:     Social History   Tobacco Use  . Smoking status: Former Smoker    Types: Cigarettes    . Smokeless tobacco: Never Used  Substance Use Topics  . Alcohol use: Never    Frequency: Never     Lives - at home  Mobility - walks by self   Family History :     Family History  Problem Relation Age of Onset  . Breast cancer Mother       Home Medications:   Prior to Admission medications   Medication Sig Start Date End Date Taking? Authorizing Provider  amLODipine (NORVASC) 5 MG tablet Take 5 mg by mouth daily.   Yes [provider]  atorvastatin (LIPITOR) 10 MG tablet Take 10 mg by mouth daily at 6 PM.   Yes [provider]  clopidogrel (PLAVIX) 75 MG tablet Take 75 mg by mouth daily.   Yes [provider]  diazepam (VALIUM) 5 MG tablet Take one tablet twice daily as needed for anxiety. 11/30/17  Yes [provider]  levothyroxine (SYNTHROID, LEVOTHROID) 88 MCG tablet Take 50-88 mcg by mouth daily before breakfast. ON MON WED FRI ON ALL OTHER DAYS   Yes [provider]  lisinopril (PRINIVIL,ZESTRIL) 5 MG tablet Take 5 mg by mouth daily.   Yes [provider]     Allergies:    No Known Allergies   Physical Exam:   Vitals  Blood pressure 112/60, pulse 68, temperature 100.3 F (37.9 C), temperature source Oral, resp. rate 18, height 5\' 6"  (1.676 m), weight 78.6 kg (173 lb 4.5 oz), SpO2 99 %.   1. General  lying in bed in NAD,    2. Normal affect and insight, Not Suicidal or Homicidal, Awake Alert, Oriented X 3.  3. No F.N deficits, ALL C.Nerves Intact, Strength 5/5 all 4 extremities, Sensation intact all 4 extremities, Plantars down going.  4. Ears and Eyes appear Normal, Conjunctivae clear, PERRLA. Moist Oral Mucosa.  5. Supple Neck, No JVD, No cervical lymphadenopathy appriciated, No Carotid Bruits.  6. Symmetrical Chest wall movement, Good air movement bilaterally, CTAB.  7. RRR, No Gallops, Rubs or Murmurs, No Parasternal Heave.  8. Positive Bowel Sounds, Abdomen Soft, No tenderness, No  organomegaly appriciated,No rebound -guarding or rigidity.  9.  No Cyanosis, slight redness from just above the ankle on the right distal lower ext to about 2/3 up to knee, + warmth  10. Good muscle tone,  joints appear normal , no effusions, Normal ROM.  11. No Palpable Lymph Nodes in Neck or Axillae     Data Review:    CBC Recent Labs  Lab 02/06/18 1339  WBC 14.3*  HGB 12.4  HCT 37.7  PLT 284  MCV 77.3*  MCH 25.4*  MCHC 32.9  RDW 16.6*  LYMPHSABS 1.6  MONOABS 1.0  EOSABS 0.1  BASOSABS 0.0   ------------------------------------------------------------------------------------------------------------------  Chemistries  Recent Labs  Lab 02/06/18 1339  NA 139  K 3.6  CL 107  CO2 22  GLUCOSE 115*  BUN 15  CREATININE 1.10*  CALCIUM 9.3  AST 39  ALT 21  ALKPHOS 75  BILITOT 0.4   ------------------------------------------------------------------------------------------------------------------ estimated creatinine clearance is 47.5 mL/min (A) (by C-G formula based on SCr of 1.1 mg/dL (H)). ------------------------------------------------------------------------------------------------------------------ No results for input(s): TSH, T4TOTAL, T3FREE, THYROIDAB in the last 72 hours.  Invalid input(s): FREET3  Coagulation profile No results for input(s): INR, PROTIME in the last 168 hours. ------------------------------------------------------------------------------------------------------------------- No results for input(s): DDIMER in the last 72 hours. -------------------------------------------------------------------------------------------------------------------  Cardiac Enzymes No results for input(s): CKMB, TROPONINI, MYOGLOBIN in the last 168 hours.  Invalid input(s): CK ------------------------------------------------------------------------------------------------------------------ No results found for:  BNP   ---------------------------------------------------------------------------------------------------------------  Urinalysis    Component Value Date/Time   COLORURINE YELLOW 11/06/2009 1006   APPEARANCEUR CLEAR 11/06/2009 1006   LABSPEC 1.026 11/06/2009 1006   PHURINE 6.5 11/06/2009 1006   GLUCOSEU NEGATIVE 11/06/2009 1006   HGBUR NEGATIVE 11/06/2009 1006   BILIRUBINUR SMALL (A) 11/06/2009 1006   KETONESUR 15 (A) 11/06/2009 1006   PROTEINUR 30 (A) 11/06/2009 1006   UROBILINOGEN 0.2 11/06/2009 1006   NITRITE NEGATIVE 11/06/2009 1006   LEUKOCYTESUR TRACE (A) 11/06/2009 1006    ----------------------------------------------------------------------------------------------------------------   Imaging Results:    No results found.     Assessment & Plan:    Principal Problem:   Cellulitis Active Problems:   Fever    Cellulitis Blood  culture x2 Start vanco iv pharmacy to dose Start ceftriaxone   Fever Likely secondary to cellulitis Check urinalysis Check CXR  Hypertension Cont Norvasc 5mg  po qday Cont lisinopril 5mg  po qday  Hyperlipidemia Cont Lipitor 10mg  po qhs  Hypothyroidism Cont levothyroxine    ? TIA 01/08/2011 Cont aspirin, cont plavix ? Unclear to me why on dual antiplatelet therapy Will continue for now, please inquire in the am  DVT Prophylaxis  Lovenox - SCDs  AM Labs Ordered, also please review Full Orders  Family Communication: Admission, patients condition and plan of care including tests being ordered have been discussed with the patient  who indicate understanding and agree with the plan and Code Status.  Code Status FULL CODE  Likely DC to  home  Condition GUARDED    Consults called: none  Admission status: observation   Time spent in minutes : 45   Pearson Grippe M.D on 02/06/2018 at 8:42 PM  Between 7am to 7pm - Pager - [phone] . After 7pm go to [email or web address] - password [facility]  [facility] - Office   [phone]

## 2018-02-07 DIAGNOSIS — L03115 Cellulitis of right lower limb: Secondary | ICD-10-CM | POA: Diagnosis not present

## 2018-02-07 LAB — COMPREHENSIVE METABOLIC PANEL
ALBUMIN: 2.9 g/dL — AB (ref 3.5–5.0)
ALT: 20 U/L (ref 14–54)
AST: 31 U/L (ref 15–41)
Alkaline Phosphatase: 64 U/L (ref 38–126)
Anion gap: 9 (ref 5–15)
BUN: 15 mg/dL (ref 6–20)
CALCIUM: 8.2 mg/dL — AB (ref 8.9–10.3)
CHLORIDE: 106 mmol/L (ref 101–111)
CO2: 21 mmol/L — AB (ref 22–32)
CREATININE: 0.97 mg/dL (ref 0.44–1.00)
GFR calc non Af Amer: 56 mL/min — ABNORMAL LOW (ref >60)
GLUCOSE: 163 mg/dL — AB (ref 65–99)
Potassium: 3.1 mmol/L — ABNORMAL LOW (ref 3.5–5.1)
SODIUM: 136 mmol/L (ref 135–145)
Total Bilirubin: 0.6 mg/dL (ref 0.3–1.2)
Total Protein: 5.8 g/dL — ABNORMAL LOW (ref 6.5–8.1)

## 2018-02-07 LAB — CBC
HCT: 30.8 % — ABNORMAL LOW (ref 36.0–46.0)
HEMOGLOBIN: 10.2 g/dL — AB (ref 12.0–15.0)
MCH: 25.2 pg — AB (ref 26.0–34.0)
MCHC: 33.1 g/dL (ref 30.0–36.0)
MCV: 76 fL — AB (ref 78.0–100.0)
Platelets: 276 10*3/uL (ref 150–400)
RBC: 4.05 MIL/uL (ref 3.87–5.11)
RDW: 16 % — ABNORMAL HIGH (ref 11.5–15.5)
WBC: 22.6 10*3/uL — ABNORMAL HIGH (ref 4.0–10.5)

## 2018-02-07 MED ORDER — POTASSIUM CHLORIDE CRYS ER 20 MEQ PO TBCR
40.0000 meq | EXTENDED_RELEASE_TABLET | Freq: Once | ORAL | Status: AC
  Administered 2018-02-07: 40 meq via ORAL
  Filled 2018-02-07: qty 2

## 2018-02-07 NOTE — Progress Notes (Signed)
Patient ID: Stacey Lambert, female   DOB: [birth year], 72 y.o.   MRN: [id number]  PROGRESS NOTE    Stacey Lambert  WUJ:[id number] DOB: 08-14-1946 DOA: 02/06/2018 PCP: Burton Apley, MD   Outpatient Specialists: Dr Teryl Lucy, Orthopedics   Brief Narrative:  Stacey Lambert  is a 72 y.o. female,  w hypertension, hyperlipidemia, apparently c/o bug bite while working outside on Thursday.  Since then has had right lower ext redness from the just above the ankle to about 2/3 up to knee.  Pt denies calf pain.  Pt denies tick bite.  Pt notes slight fever. Patient has had no fever overnight.Leg is however more swollen   Assessment & Plan:   Principal Problem:   Cellulitis Active Problems:   Fever   #1 right lower extremity cellulitis: Leg is still red. Tender. Swelling is however less. Continue IV antibiotics and await culture and sensitivity results. Patient on vancomycin and ceftriaxone.  #2 hypertension: Continue home regimen. Blood pressure appears controlled. On lisinopril and amlodipine  #3 hyperlipidemia: continue Lipitor  #4 hypothyroidism: Continue levothyroxine.  #5 leukocytosis: White count has increased. Check white count in the morning. Probably secondary to ongoing cellulitis   DVT prophylaxis: Lovenox Code Status: full code Family Communication: None available. Discussed with patient Disposition Plan: home   Consultants:   None  Procedures:   none   Antimicrobials:   vancomycin and ceftriaxone day #1    Subjective: Patient says she is feeling better today. No significant fever or chills. Decreased swelling in her legs  Objective: Vitals:   02/06/18 1828 02/06/18 1930 02/06/18 2002 02/07/18 0522  BP: 98/70 (!) 111/58 112/60 (!) 111/50  Pulse: 64 69 68 73  Resp: 18  18 18   Temp:   100.3 F (37.9 C) (!) 100.8 F (38.2 C)  TempSrc:   Oral Oral  SpO2: 100% 100% 99% 97%  Weight:   78.6 kg (173 lb 4.5 oz) 78.6 kg (173 lb 4.5 oz)  Height:   5\' 6"  (1.676 m)      Intake/Output Summary (Last 24 hours) at 02/07/2018 0735 Last data filed at 02/07/2018 0319 Gross per 24 hour  Intake 400 ml  Output 400 ml  Net 0 ml   Filed Weights   02/06/18 1327 02/06/18 2002 02/07/18 0522  Weight: 80.7 kg (178 lb) 78.6 kg (173 lb 4.5 oz) 78.6 kg (173 lb 4.5 oz)    Examination:  General exam: Appears calm and comfortable  Respiratory system: Clear to auscultation. Respiratory effort normal. Cardiovascular system: S1 & S2 heard, RRR. No JVD, murmurs, rubs, gallops or clicks. No pedal edema. Gastrointestinal system: Abdomen is nondistended, soft and nontender. No organomegaly or masses felt. Normal bowel sounds heard. Central nervous system: Alert and oriented. No focal neurological deficits. Extremities: Symmetric 5 x 5 power. Right lower leg swelling around the ankle, read, tender and warm Skin: No rashes, lesions or ulcers, cellulitis of the right lower extremity around the ankle area Psychiatry: Judgement and insight appear normal. Mood & affect appropriate.     Data Reviewed: I have personally reviewed following labs and imaging studies  CBC: Recent Labs  Lab 02/06/18 1339 02/07/18 0304  WBC 14.3* 22.6*  NEUTROABS 11.5*  --   HGB 12.4 10.2*  HCT 37.7 30.8*  MCV 77.3* 76.0*  PLT 284 276   Basic Metabolic Panel: Recent Labs  Lab 02/06/18 1339 02/07/18 0304  NA 139 136  K 3.6 3.1*  CL 107 106  CO2 22 21*  GLUCOSE 115* 163*  BUN 15 15  CREATININE 1.10* 0.97  CALCIUM 9.3 8.2*   GFR: Estimated Creatinine Clearance: 53.8 mL/min (by C-G formula based on SCr of 0.97 mg/dL). Liver Function Tests: Recent Labs  Lab 02/06/18 1339 02/07/18 0304  AST 39 31  ALT 21 20  ALKPHOS 75 64  BILITOT 0.4 0.6  PROT 7.3 5.8*  ALBUMIN 3.8 2.9*   No results for input(s): LIPASE, AMYLASE in the last 168 hours. No results for input(s): AMMONIA in the last 168 hours. Coagulation Profile: No results for input(s): INR, PROTIME in the last 168  hours. Cardiac Enzymes: No results for input(s): CKTOTAL, CKMB, CKMBINDEX, TROPONINI in the last 168 hours. BNP (last 3 results) No results for input(s): PROBNP in the last 8760 hours. HbA1C: No results for input(s): HGBA1C in the last 72 hours. CBG: No results for input(s): GLUCAP in the last 168 hours. Lipid Profile: No results for input(s): CHOL, HDL, LDLCALC, TRIG, CHOLHDL, LDLDIRECT in the last 72 hours. Thyroid Function Tests: No results for input(s): TSH, T4TOTAL, FREET4, T3FREE, THYROIDAB in the last 72 hours. Anemia Panel: No results for input(s): VITAMINB12, FOLATE, FERRITIN, TIBC, IRON, RETICCTPCT in the last 72 hours. Urine analysis:    Component Value Date/Time   COLORURINE YELLOW 02/06/2018 2101   APPEARANCEUR HAZY (A) 02/06/2018 2101   LABSPEC 1.010 02/06/2018 2101   PHURINE 5.0 02/06/2018 2101   GLUCOSEU NEGATIVE 02/06/2018 2101   HGBUR SMALL (A) 02/06/2018 2101   BILIRUBINUR NEGATIVE 02/06/2018 2101   KETONESUR NEGATIVE 02/06/2018 2101   PROTEINUR NEGATIVE 02/06/2018 2101   UROBILINOGEN 0.2 11/06/2009 1006   NITRITE NEGATIVE 02/06/2018 2101   LEUKOCYTESUR NEGATIVE 02/06/2018 2101   Sepsis Labs: @LABRCNTIP (procalcitonin:4,lacticidven:4)  )No results found for this or any previous visit (from the past 240 hour(s)).       Radiology Studies: Dg Chest 2 View  Result Date: 02/06/2018 CLINICAL DATA:  Fever. EXAM: CHEST - 2 VIEW COMPARISON:  08/21/2014 and prior exams FINDINGS: Cardiomegaly and mild peribronchial thickening again noted. There is no evidence of focal airspace disease, pulmonary edema, suspicious pulmonary nodule/mass, pleural effusion, or pneumothorax. No acute bony abnormalities are identified. IMPRESSION: Cardiomegaly without evidence of acute cardiopulmonary disease. Electronically Signed   By: Harmon PierJeffrey  Hu M.D.   On: 02/06/2018 22:35        Scheduled Meds: . amLODipine  5 mg Oral Daily  . atorvastatin  10 mg Oral q1800  . clopidogrel  75  mg Oral Daily  . enoxaparin (LOVENOX) injection  40 mg Subcutaneous Q24H  . levothyroxine  88 mcg Oral Once per day on Sun Tue Thu Sat  . lisinopril  5 mg Oral Daily  . potassium chloride  40 mEq Oral Once  . sodium chloride flush  3 mL Intravenous Q12H   Continuous Infusions: . sodium chloride    . cefTRIAXone (ROCEPHIN)  IV Stopped (02/06/18 2328)  . vancomycin Stopped (02/07/18 0028)     LOS: 0 days    Time spent: 33 minutes    GARBA,LAWAL, MD [facility] Pager [phone] If 7PM-7AM, please contact night-coverage [email or web address] Password [email or web address] 02/07/2018, 7:35 AM

## 2018-02-08 ENCOUNTER — Inpatient Hospital Stay (HOSPITAL_COMMUNITY): Payer: Medicare HMO

## 2018-02-08 ENCOUNTER — Observation Stay (HOSPITAL_COMMUNITY): Payer: Medicare HMO

## 2018-02-08 DIAGNOSIS — A46 Erysipelas: Secondary | ICD-10-CM | POA: Diagnosis present

## 2018-02-08 DIAGNOSIS — R609 Edema, unspecified: Secondary | ICD-10-CM | POA: Diagnosis not present

## 2018-02-08 DIAGNOSIS — Z96653 Presence of artificial knee joint, bilateral: Secondary | ICD-10-CM | POA: Diagnosis present

## 2018-02-08 DIAGNOSIS — E785 Hyperlipidemia, unspecified: Secondary | ICD-10-CM | POA: Diagnosis present

## 2018-02-08 DIAGNOSIS — Z7902 Long term (current) use of antithrombotics/antiplatelets: Secondary | ICD-10-CM | POA: Diagnosis not present

## 2018-02-08 DIAGNOSIS — E669 Obesity, unspecified: Secondary | ICD-10-CM | POA: Diagnosis present

## 2018-02-08 DIAGNOSIS — R509 Fever, unspecified: Secondary | ICD-10-CM | POA: Diagnosis not present

## 2018-02-08 DIAGNOSIS — E039 Hypothyroidism, unspecified: Secondary | ICD-10-CM | POA: Diagnosis present

## 2018-02-08 DIAGNOSIS — I10 Essential (primary) hypertension: Secondary | ICD-10-CM | POA: Diagnosis present

## 2018-02-08 DIAGNOSIS — Z79899 Other long term (current) drug therapy: Secondary | ICD-10-CM | POA: Diagnosis not present

## 2018-02-08 DIAGNOSIS — Z6830 Body mass index (BMI) 30.0-30.9, adult: Secondary | ICD-10-CM | POA: Diagnosis not present

## 2018-02-08 DIAGNOSIS — L03115 Cellulitis of right lower limb: Secondary | ICD-10-CM | POA: Diagnosis present

## 2018-02-08 DIAGNOSIS — E079 Disorder of thyroid, unspecified: Secondary | ICD-10-CM | POA: Diagnosis present

## 2018-02-08 DIAGNOSIS — W57XXXA Bitten or stung by nonvenomous insect and other nonvenomous arthropods, initial encounter: Secondary | ICD-10-CM | POA: Diagnosis present

## 2018-02-08 DIAGNOSIS — D638 Anemia in other chronic diseases classified elsewhere: Secondary | ICD-10-CM | POA: Diagnosis present

## 2018-02-08 DIAGNOSIS — Z87891 Personal history of nicotine dependence: Secondary | ICD-10-CM | POA: Diagnosis not present

## 2018-02-08 DIAGNOSIS — Z8673 Personal history of transient ischemic attack (TIA), and cerebral infarction without residual deficits: Secondary | ICD-10-CM | POA: Diagnosis not present

## 2018-02-08 DIAGNOSIS — Z803 Family history of malignant neoplasm of breast: Secondary | ICD-10-CM | POA: Diagnosis not present

## 2018-02-08 DIAGNOSIS — E876 Hypokalemia: Secondary | ICD-10-CM | POA: Diagnosis not present

## 2018-02-08 DIAGNOSIS — B955 Unspecified streptococcus as the cause of diseases classified elsewhere: Secondary | ICD-10-CM | POA: Diagnosis present

## 2018-02-08 DIAGNOSIS — D72829 Elevated white blood cell count, unspecified: Secondary | ICD-10-CM | POA: Diagnosis not present

## 2018-02-08 LAB — BASIC METABOLIC PANEL
Anion gap: 10 (ref 5–15)
BUN: 9 mg/dL (ref 6–20)
CALCIUM: 8.7 mg/dL — AB (ref 8.9–10.3)
CO2: 21 mmol/L — ABNORMAL LOW (ref 22–32)
CREATININE: 0.81 mg/dL (ref 0.44–1.00)
Chloride: 107 mmol/L (ref 101–111)
GFR calc Af Amer: >60 mL/min (ref >60)
GLUCOSE: 121 mg/dL — AB (ref 65–99)
Potassium: 3.5 mmol/L (ref 3.5–5.1)
Sodium: 138 mmol/L (ref 135–145)

## 2018-02-08 LAB — CBC
HCT: 29.6 % — ABNORMAL LOW (ref 36.0–46.0)
Hemoglobin: 10 g/dL — ABNORMAL LOW (ref 12.0–15.0)
MCH: 25.7 pg — ABNORMAL LOW (ref 26.0–34.0)
MCHC: 33.8 g/dL (ref 30.0–36.0)
MCV: 76.1 fL — AB (ref 78.0–100.0)
PLATELETS: 279 10*3/uL (ref 150–400)
RBC: 3.89 MIL/uL (ref 3.87–5.11)
RDW: 15.9 % — AB (ref 11.5–15.5)
WBC: 27.6 10*3/uL — ABNORMAL HIGH (ref 4.0–10.5)

## 2018-02-08 MED ORDER — ACETAMINOPHEN 500 MG PO TABS
1000.0000 mg | ORAL_TABLET | Freq: Four times a day (QID) | ORAL | Status: DC | PRN
Start: 2018-02-08 — End: 2018-02-11

## 2018-02-08 MED ORDER — IBUPROFEN 400 MG PO TABS: 400.0000 mg | ORAL_TABLET | Freq: Two times a day (BID) | ORAL | Status: DC | PRN

## 2018-02-08 MED ORDER — VANCOMYCIN HCL IN DEXTROSE 1-5 GM/200ML-% IV SOLN
1000.0000 mg | Freq: Two times a day (BID) | INTRAVENOUS | Status: DC
  Administered 2018-02-08 – 2018-02-10 (×4): 1000 mg via INTRAVENOUS
  Filled 2018-02-08 (×4): qty 200

## 2018-02-08 NOTE — Progress Notes (Signed)
Right lower extremity venous duplex has been completed. Negative for DVT.  02/08/18 3:58 PM Olen CordialGreg Jailin Manocchio RVT

## 2018-02-08 NOTE — Progress Notes (Signed)
Progress Note    Stacey Lambert  ZOX:[id number] DOB: 1946-02-20  DOA: 02/06/2018 PCP: Burton Apley, MD    Brief Narrative:     Medical records reviewed and are as summarized below:  Stacey Lambert is an 72 y.o. female  w hypertension, hyperlipidemia, apparently c/o bug bite while working outside on Thursday.  Since then has had right lower ext redness from the just above the ankle to about 2/3 up to knee.  Pt denies calf pain.  Pt denies tick bite.  Pt notes slight fever. ED requesting admission for cellulitis    Assessment/Plan:   Principal Problem:   Cellulitis Active Problems:   Fever   Benign essential HTN   Hypothyroidism  Fever due to Cellulitis Blood culture x2 NGTD Vanc/ceftriaxone -still with fever-- check duplex and x ray-- may require CT scan -? Septic joint-- patient reported to nursing some pain in the joint  Leukocytosis -trend -need to r/o abcess  Hypertension Cont lisinopril 5mg  po qday -hold norvasc and BP on lower side  Hyperlipidemia Cont Lipitor 10mg  po qhs  Hypothyroidism Cont levothyroxine    obesity Body mass index is 29.96 kg/m.   Family Communication/Anticipated D/C date and plan/Code Status   DVT prophylaxis: Lovenox ordered. Code Status: Full Code.  Family Communication:  Disposition Plan: pending improvement   Medical Consultants:    None.     Subjective:   Feeling some better than admission Fever last night  Objective:    Vitals:   02/07/18 2334 02/08/18 0503 02/08/18 0900 02/08/18 1350  BP:  (!) 113/59 (!) 120/55 138/69  Pulse:  78 77 87  Resp:  18    Temp: 99.6 F (37.6 C) 99.9 F (37.7 C)  (!) 101.8 F (38.8 C)  TempSrc:  Oral  Oral  SpO2:  96%  100%  Weight:  84.2 kg (185 lb 10 oz)    Height:        Intake/Output Summary (Last 24 hours) at 02/08/2018 1400 Last data filed at 02/08/2018 1034 Gross per 24 hour  Intake 950 ml  Output 500 ml  Net 450 ml   Filed Weights   02/06/18 2002  02/07/18 0522 02/08/18 0503  Weight: 78.6 kg (173 lb 4.5 oz) 78.6 kg (173 lb 4.5 oz) 84.2 kg (185 lb 10 oz)    Exam: In bed, NAD-- skin warm and flushed rrr Right ankle with edema and erythema to a few inches above ankle   Data Reviewed:   I have personally reviewed following labs and imaging studies:  Labs: Labs show the following:   Basic Metabolic Panel: Recent Labs  Lab 02/06/18 1339 02/07/18 0304 02/08/18 0722  NA 139 136 138  K 3.6 3.1* 3.5  CL 107 106 107  CO2 22 21* 21*  GLUCOSE 115* 163* 121*  BUN 15 15 9   CREATININE 1.10* 0.97 0.81  CALCIUM 9.3 8.2* 8.7*   GFR Estimated Creatinine Clearance: 66.7 mL/min (by C-G formula based on SCr of 0.81 mg/dL). Liver Function Tests: Recent Labs  Lab 02/06/18 1339 02/07/18 0304  AST 39 31  ALT 21 20  ALKPHOS 75 64  BILITOT 0.4 0.6  PROT 7.3 5.8*  ALBUMIN 3.8 2.9*   No results for input(s): LIPASE, AMYLASE in the last 168 hours. No results for input(s): AMMONIA in the last 168 hours. Coagulation profile No results for input(s): INR, PROTIME in the last 168 hours.  CBC: Recent Labs  Lab 02/06/18 1339 02/07/18 0304 02/08/18 0722  WBC  14.3* 22.6* 27.6*  NEUTROABS 11.5*  --   --   HGB 12.4 10.2* 10.0*  HCT 37.7 30.8* 29.6*  MCV 77.3* 76.0* 76.1*  PLT 284 276 279   Cardiac Enzymes: No results for input(s): CKTOTAL, CKMB, CKMBINDEX, TROPONINI in the last 168 hours. BNP (last 3 results) No results for input(s): PROBNP in the last 8760 hours. CBG: No results for input(s): GLUCAP in the last 168 hours. D-Dimer: No results for input(s): DDIMER in the last 72 hours. Hgb A1c: No results for input(s): HGBA1C in the last 72 hours. Lipid Profile: No results for input(s): CHOL, HDL, LDLCALC, TRIG, CHOLHDL, LDLDIRECT in the last 72 hours. Thyroid function studies: No results for input(s): TSH, T4TOTAL, T3FREE, THYROIDAB in the last 72 hours.  Invalid input(s): FREET3 Anemia work up: No results for input(s):  VITAMINB12, FOLATE, FERRITIN, TIBC, IRON, RETICCTPCT in the last 72 hours. Sepsis Labs: Recent Labs  Lab 02/06/18 1339 02/06/18 1357 02/06/18 1836 02/07/18 0304 02/08/18 0722  WBC 14.3*  --   --  22.6* 27.6*  LATICACIDVEN  --  1.05 1.67  --   --     Microbiology Recent Results (from the past 240 hour(s))  Culture, blood (Routine X 2) w Reflex to ID Panel     Status: None (Preliminary result)   Collection Time: 02/06/18  8:40 PM  Result Value Ref Range Status   Specimen Description BLOOD LEFT HAND  Final   Special Requests   Final    BOTTLES DRAWN AEROBIC AND ANAEROBIC Blood Culture adequate volume   Culture   Final    NO GROWTH < 24 HOURS Performed at [facility], [address], [place], [place] [address]    Report Status PENDING  Incomplete  Culture, blood (Routine X 2) w Reflex to ID Panel     Status: None (Preliminary result)   Collection Time: 02/06/18  8:47 PM  Result Value Ref Range Status   Specimen Description BLOOD RIGHT ANTECUBITAL  Final   Special Requests   Final    BOTTLES DRAWN AEROBIC AND ANAEROBIC Blood Culture adequate volume   Culture   Final    NO GROWTH < 24 HOURS Performed at [facility], [address], [place], [place] [address]    Report Status PENDING  Incomplete    Procedures and diagnostic studies:  Dg Chest 2 View  Result Date: 02/06/2018 CLINICAL DATA:  Fever. EXAM: CHEST - 2 VIEW COMPARISON:  08/21/2014 and prior exams FINDINGS: Cardiomegaly and mild peribronchial thickening again noted. There is no evidence of focal airspace disease, pulmonary edema, suspicious pulmonary nodule/mass, pleural effusion, or pneumothorax. No acute bony abnormalities are identified. IMPRESSION: Cardiomegaly without evidence of acute cardiopulmonary disease. Electronically Signed   By: Harmon Pier M.D.   On: 02/06/2018 22:35    Medications:   . atorvastatin  10 mg Oral q1800  . clopidogrel  75 mg Oral Daily  . enoxaparin (LOVENOX) injection  40  mg Subcutaneous Q24H  . levothyroxine  88 mcg Oral Once per day on Sun Tue Thu Sat  . lisinopril  5 mg Oral Daily  . sodium chloride flush  3 mL Intravenous Q12H   Continuous Infusions: . sodium chloride    . cefTRIAXone (ROCEPHIN)  IV Stopped (02/07/18 2117)  . vancomycin Stopped (02/07/18 2217)     LOS: 0 days   Joseph Art  [facility]   *Please refer to [email or web address], password [email or web address] to get updated schedule on who will round  on this patient, as hospitalists switch teams weekly. If 7PM-7AM, please contact night-coverage at [email or web address], password [email or web address] for any overnight needs.  02/08/2018, 2:00 PM

## 2018-02-08 NOTE — Progress Notes (Signed)
Patient's temp elevated- 101.8. PRN Tylenol administered, Dr. Benjamine MolaVann notified.

## 2018-02-08 NOTE — Progress Notes (Signed)
Temp 99.6- Will continue to monitor.

## 2018-02-08 NOTE — Progress Notes (Signed)
Pharmacy Antibiotic Note  Wyman SongsterJean E Yohannes is a 72 y.o. female admitted on 02/06/2018 with cellulitis.  Pharmacy has been consulted for vancomycin dosing.  Renal function is improving overall.  Tmax 101.8, WBC up 27.6, LA 1.67.   Plan: Change vanc to 1gm IV Q12H CTX 1gm IV Q24H Monitor renal fxn, clinical progress, vanc trough prior to 4th dose   Height: 5\' 6"  (167.6 cm) Weight: 185 lb 10 oz (84.2 kg) IBW/kg (Calculated) : 59.3  Temp (24hrs), Avg:100.8 F (38.2 C), Min:99.6 F (37.6 C), Max:101.8 F (38.8 C)  Recent Labs  Lab 02/06/18 1339 02/06/18 1357 02/06/18 1836 02/07/18 0304 02/08/18 0722  WBC 14.3*  --   --  22.6* 27.6*  CREATININE 1.10*  --   --  0.97 0.81  LATICACIDVEN  --  1.05 1.67  --   --     Estimated Creatinine Clearance: 66.7 mL/min (by C-G formula based on SCr of 0.81 mg/dL).    No Known Allergies   Vanc 6/1 >> CTX 6/1 >>  6/1BCx - NGTD   Rosaleen Mazer D. Laney Potashang, PharmD, BCPS, BCCCP Pager:  [phone] 02/08/2018, 2:00 PM

## 2018-02-09 DIAGNOSIS — A46 Erysipelas: Secondary | ICD-10-CM

## 2018-02-09 LAB — BASIC METABOLIC PANEL
Anion gap: 9 (ref 5–15)
BUN: 6 mg/dL (ref 6–20)
CHLORIDE: 108 mmol/L (ref 101–111)
CO2: 23 mmol/L (ref 22–32)
CREATININE: 0.82 mg/dL (ref 0.44–1.00)
Calcium: 8.4 mg/dL — ABNORMAL LOW (ref 8.9–10.3)
GFR calc Af Amer: >60 mL/min (ref >60)
Glucose, Bld: 129 mg/dL — ABNORMAL HIGH (ref 65–99)
POTASSIUM: 2.9 mmol/L — AB (ref 3.5–5.1)
SODIUM: 140 mmol/L (ref 135–145)

## 2018-02-09 LAB — MAGNESIUM: Magnesium: 1.6 mg/dL — ABNORMAL LOW (ref 1.7–2.4)

## 2018-02-09 LAB — CBC
HCT: 29.2 % — ABNORMAL LOW (ref 36.0–46.0)
HEMOGLOBIN: 10 g/dL — AB (ref 12.0–15.0)
MCH: 25.7 pg — ABNORMAL LOW (ref 26.0–34.0)
MCHC: 34.2 g/dL (ref 30.0–36.0)
MCV: 75.1 fL — ABNORMAL LOW (ref 78.0–100.0)
Platelets: 292 10*3/uL (ref 150–400)
RBC: 3.89 MIL/uL (ref 3.87–5.11)
RDW: 15.9 % — ABNORMAL HIGH (ref 11.5–15.5)
WBC: 27 10*3/uL — ABNORMAL HIGH (ref 4.0–10.5)

## 2018-02-09 MED ORDER — POLYETHYLENE GLYCOL 3350 17 G PO PACK: 17.0000 g | PACK | Freq: Every day | ORAL | Status: DC | PRN

## 2018-02-09 MED ORDER — POTASSIUM CHLORIDE CRYS ER 20 MEQ PO TBCR
40.0000 meq | EXTENDED_RELEASE_TABLET | ORAL | Status: AC
  Administered 2018-02-09 (×4): 40 meq via ORAL
  Filled 2018-02-09 (×4): qty 2

## 2018-02-09 MED ORDER — DOCUSATE SODIUM 100 MG PO CAPS
100.0000 mg | ORAL_CAPSULE | Freq: Every day | ORAL | Status: DC
  Administered 2018-02-09 – 2018-02-11 (×3): 100 mg via ORAL
  Filled 2018-02-09 (×3): qty 1

## 2018-02-09 NOTE — Progress Notes (Signed)
Progress Note    Stacey Lambert  ZOX:[id number] DOB: [birth year]  DOA: 02/06/2018 PCP: Burton Apley, MD    Brief Narrative:     Medical records reviewed and are as summarized below:  Stacey Lambert is an 72 y.o. female  w hypertension, hyperlipidemia, apparently c/o bug bite while working outside on Thursday.  Since then has had right lower ext redness from the just above the ankle to about 2/3 up to knee.  Pt denies calf pain.  Pt denies tick bite.  Pt notes slight fever. ED requesting admission for cellulitis    Assessment/Plan:   Principal Problem:   Cellulitis Active Problems:   Fever   Benign essential HTN   Hypothyroidism  Fever due to Cellulitis/Erysipelas Blood culture x2 NGTD Vanc/ceftriaxone - well demarcated borders--- most likely strep -elevate extremity -mark boarders -duplex/x ray negative  Leukocytosis -trend-- appears to have peaked  Hypertension Cont lisinopril 5mg  po qday -hold norvasc and BP on lower side  Hyperlipidemia Cont Lipitor 10mg  po qhs  Hypothyroidism Cont levothyroxine   Anemia -check Fe panel in AM  Hypokalemia -replete PO -recheck in AM  obesity Body mass index is 30.49 kg/m.   Family Communication/Anticipated D/C date and plan/Code Status   DVT prophylaxis: Lovenox ordered. Code Status: Full Code.  Family Communication: daughter at bedside Disposition Plan: pending improvement   Medical Consultants:    None.    Subjective:   Last fever was yesterday afternoon  Objective:    Vitals:   02/08/18 1350 02/08/18 1440 02/08/18 2131 02/09/18 0528  BP: 138/69  (!) 122/59 128/67  Pulse: 87  75 82  Resp:   18 18  Temp: (!) 101.8 F (38.8 C) 99.6 F (37.6 C) 99.4 F (37.4 C) 98.8 F (37.1 C)  TempSrc: Oral Oral Oral Oral  SpO2: 100%  100% 98%  Weight:    85.7 kg (188 lb 15 oz)  Height:        Intake/Output Summary (Last 24 hours) at 02/09/2018 0806 Last data filed at 02/09/2018 0449 Gross per 24  hour  Intake 500 ml  Output 750 ml  Net -250 ml   Filed Weights   02/07/18 0522 02/08/18 0503 02/09/18 0528  Weight: 78.6 kg (173 lb 4.5 oz) 84.2 kg (185 lb 10 oz) 85.7 kg (188 lb 15 oz)    Exam: Up walking to bathroom Right leg with edema compared to left and well demarcated boarders consistent with erysipelas rrr No increased work of breathing  Good joint movement (right ankle) w/o pain   Data Reviewed:   I have personally reviewed following labs and imaging studies:  Labs: Labs show the following:   Basic Metabolic Panel: Recent Labs  Lab 02/06/18 1339 02/07/18 0304 02/08/18 0722 02/09/18 0545  NA 139 136 138 140  K 3.6 3.1* 3.5 2.9*  CL 107 106 107 108  CO2 22 21* 21* 23  GLUCOSE 115* 163* 121* 129*  BUN 15 15 9 6   CREATININE 1.10* 0.97 0.81 0.82  CALCIUM 9.3 8.2* 8.7* 8.4*   GFR Estimated Creatinine Clearance: 66.4 mL/min (by C-G formula based on SCr of 0.82 mg/dL). Liver Function Tests: Recent Labs  Lab 02/06/18 1339 02/07/18 0304  AST 39 31  ALT 21 20  ALKPHOS 75 64  BILITOT 0.4 0.6  PROT 7.3 5.8*  ALBUMIN 3.8 2.9*   No results for input(s): LIPASE, AMYLASE in the last 168 hours. No results for input(s): AMMONIA in the last 168 hours. Coagulation profile  No results for input(s): INR, PROTIME in the last 168 hours.  CBC: Recent Labs  Lab 02/06/18 1339 02/07/18 0304 02/08/18 0722 02/09/18 0545  WBC 14.3* 22.6* 27.6* 27.0*  NEUTROABS 11.5*  --   --   --   HGB 12.4 10.2* 10.0* 10.0*  HCT 37.7 30.8* 29.6* 29.2*  MCV 77.3* 76.0* 76.1* 75.1*  PLT 284 276 279 292   Cardiac Enzymes: No results for input(s): CKTOTAL, CKMB, CKMBINDEX, TROPONINI in the last 168 hours. BNP (last 3 results) No results for input(s): PROBNP in the last 8760 hours. CBG: No results for input(s): GLUCAP in the last 168 hours. D-Dimer: No results for input(s): DDIMER in the last 72 hours. Hgb A1c: No results for input(s): HGBA1C in the last 72 hours. Lipid  Profile: No results for input(s): CHOL, HDL, LDLCALC, TRIG, CHOLHDL, LDLDIRECT in the last 72 hours. Thyroid function studies: No results for input(s): TSH, T4TOTAL, T3FREE, THYROIDAB in the last 72 hours.  Invalid input(s): FREET3 Anemia work up: No results for input(s): VITAMINB12, FOLATE, FERRITIN, TIBC, IRON, RETICCTPCT in the last 72 hours. Sepsis Labs: Recent Labs  Lab 02/06/18 1339 02/06/18 1357 02/06/18 1836 02/07/18 0304 02/08/18 0722 02/09/18 0545  WBC 14.3*  --   --  22.6* 27.6* 27.0*  LATICACIDVEN  --  1.05 1.67  --   --   --     Microbiology Recent Results (from the past 240 hour(s))  Culture, blood (Routine X 2) w Reflex to ID Panel     Status: None (Preliminary result)   Collection Time: 02/06/18  8:40 PM  Result Value Ref Range Status   Specimen Description BLOOD LEFT HAND  Final   Special Requests   Final    BOTTLES DRAWN AEROBIC AND ANAEROBIC Blood Culture adequate volume   Culture   Final    NO GROWTH 2 DAYS Performed at [facility], [address] [place], [place] [address]    Report Status PENDING  Incomplete  Culture, blood (Routine X 2) w Reflex to ID Panel     Status: None (Preliminary result)   Collection Time: 02/06/18  8:47 PM  Result Value Ref Range Status   Specimen Description BLOOD RIGHT ANTECUBITAL  Final   Special Requests   Final    BOTTLES DRAWN AEROBIC AND ANAEROBIC Blood Culture adequate volume   Culture   Final    NO GROWTH 2 DAYS Performed at [facility], [address] [place], [place] [address]    Report Status PENDING  Incomplete    Procedures and diagnostic studies:  Dg Ankle 2 Views Right  Result Date: 02/08/2018 CLINICAL DATA:  72 year old female with right lower extremity cellulitis. EXAM: RIGHT ANKLE - 2 VIEW COMPARISON:  Right foot series 02/22/2013 and earlier. FINDINGS: Generalized soft tissue swelling. No soft tissue gas. No definite radiopaque foreign body identified. Preserved right mortise joint  alignment. No joint effusion identified. No acute osseous abnormality identified. IMPRESSION: Soft tissue swelling with no acute osseous abnormality identified. Electronically Signed   By: Odessa FlemingH  Hall M.D.   On: 02/08/2018 14:41    Medications:   . atorvastatin  10 mg Oral q1800  . clopidogrel  75 mg Oral Daily  . enoxaparin (LOVENOX) injection  40 mg Subcutaneous Q24H  . levothyroxine  88 mcg Oral Once per day on Sun Tue Thu Sat  . lisinopril  5 mg Oral Daily  . potassium chloride  40 mEq Oral Q4H  . sodium chloride flush  3  mL Intravenous Q12H   Continuous Infusions: . sodium chloride    . cefTRIAXone (ROCEPHIN)  IV Stopped (02/08/18 2131)  . vancomycin Stopped (02/09/18 0536)     LOS: 1 day   Joseph Art  [facility]   *Please refer to [email or web address], password [email or web address] to get updated schedule on who will round on this patient, as hospitalists switch teams weekly. If 7PM-7AM, please contact night-coverage at [email or web address], password [email or web address] for any overnight needs.  02/09/2018, 8:06 AM

## 2018-02-10 DIAGNOSIS — Z96653 Presence of artificial knee joint, bilateral: Secondary | ICD-10-CM

## 2018-02-10 DIAGNOSIS — D72829 Elevated white blood cell count, unspecified: Secondary | ICD-10-CM

## 2018-02-10 DIAGNOSIS — Z803 Family history of malignant neoplasm of breast: Secondary | ICD-10-CM

## 2018-02-10 DIAGNOSIS — Z79899 Other long term (current) drug therapy: Secondary | ICD-10-CM

## 2018-02-10 DIAGNOSIS — Z8673 Personal history of transient ischemic attack (TIA), and cerebral infarction without residual deficits: Secondary | ICD-10-CM

## 2018-02-10 DIAGNOSIS — Z87891 Personal history of nicotine dependence: Secondary | ICD-10-CM

## 2018-02-10 LAB — BASIC METABOLIC PANEL
Anion gap: 6 (ref 5–15)
BUN: 6 mg/dL (ref 6–20)
CO2: 23 mmol/L (ref 22–32)
Calcium: 8.5 mg/dL — ABNORMAL LOW (ref 8.9–10.3)
Chloride: 108 mmol/L (ref 101–111)
Creatinine, Ser: 0.77 mg/dL (ref 0.44–1.00)
GFR calc Af Amer: >60 mL/min (ref >60)
GFR calc non Af Amer: >60 mL/min (ref >60)
GLUCOSE: 102 mg/dL — AB (ref 65–99)
POTASSIUM: 4.3 mmol/L (ref 3.5–5.1)
Sodium: 137 mmol/L (ref 135–145)

## 2018-02-10 LAB — CBC
HCT: 29.5 % — ABNORMAL LOW (ref 36.0–46.0)
Hemoglobin: 9.9 g/dL — ABNORMAL LOW (ref 12.0–15.0)
MCH: 25.3 pg — AB (ref 26.0–34.0)
MCHC: 33.6 g/dL (ref 30.0–36.0)
MCV: 75.3 fL — ABNORMAL LOW (ref 78.0–100.0)
PLATELETS: 389 10*3/uL (ref 150–400)
RBC: 3.92 MIL/uL (ref 3.87–5.11)
RDW: 15.8 % — AB (ref 11.5–15.5)
WBC: 19.6 10*3/uL — ABNORMAL HIGH (ref 4.0–10.5)

## 2018-02-10 LAB — IRON AND TIBC
IRON: 13 ug/dL — AB (ref 28–170)
SATURATION RATIOS: 8 % — AB (ref 10.4–31.8)
TIBC: 168 ug/dL — AB (ref 250–450)
UIBC: 155 ug/dL

## 2018-02-10 LAB — FERRITIN: Ferritin: 627 ng/mL — ABNORMAL HIGH (ref 11–307)

## 2018-02-10 MED ORDER — MAGNESIUM SULFATE 2 GM/50ML IV SOLN
2.0000 g | Freq: Once | INTRAVENOUS | Status: AC
  Administered 2018-02-10: 2 g via INTRAVENOUS
  Filled 2018-02-10: qty 50

## 2018-02-10 MED ORDER — CEFAZOLIN SODIUM-DEXTROSE 1-4 GM/50ML-% IV SOLN
1.0000 g | Freq: Three times a day (TID) | INTRAVENOUS | Status: DC
  Administered 2018-02-10 – 2018-02-11 (×3): 1 g via INTRAVENOUS
  Filled 2018-02-10 (×4): qty 50

## 2018-02-10 MED ORDER — ONDANSETRON HCL 4 MG/2ML IJ SOLN
4.0000 mg | Freq: Four times a day (QID) | INTRAMUSCULAR | Status: DC | PRN
  Administered 2018-02-11: 4 mg via INTRAVENOUS
  Filled 2018-02-10 (×2): qty 2

## 2018-02-10 NOTE — Progress Notes (Signed)
Progress Note    Stacey Lambert  ZOX:[id number] DOB: [birth year]  DOA: 02/06/2018 PCP: Burton Apleyoberts, Ronald, MD    Brief Narrative:     Medical records reviewed and are as summarized below:  Stacey Lambert is an 72 y.o. female  w hypertension, hyperlipidemia, apparently c/o bug bite while working outside on Thursday.  Since then has had right lower ext redness from the just above the ankle to about 2/3 up to knee.  Has been slow to recover with IV rocephin/vanc-- ID consulted and abx changed to cefazolin and if improved in the AM, plan to d/c on keflex 500 mg QID for 3 more days per ID    Assessment/Plan:   Principal Problem:   Cellulitis Active Problems:   Fever   Benign essential HTN   Hypothyroidism  Fever due to Cellulitis/Erysipelas  Blood culture x2 NGTD Vanc/ceftriaxone changed to cefazolin - well demarcated borders--- most likely strep -elevate extremity -marked boarders -duplex/x ray unconcerning  Leukocytosis -trending down  Hypertension Cont lisinopril 5mg  po qday -hold norvasc and BP on lower side  Hyperlipidemia Cont Lipitor 10mg  po qhs  Hypothyroidism Cont levothyroxine   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   Family Communication/Anticipated D/C date and plan/Code Status   DVT prophylaxis: Lovenox ordered. Code Status: Full Code.  Family Communication: at bedside Disposition Plan: home 24-48 hours   Medical Consultants:    ID    Subjective:   No fever, able to move ankle but it feels tight  Objective:    Vitals:   02/09/18 0528 02/09/18 1352 02/09/18 2130 02/10/18 0508  BP: 128/67 127/79 116/67 113/70  Pulse: 82 (!) 101 91 77  Resp: 18 15 17 17   Temp: 98.8 F (37.1 C) 97.9 F (36.6 C) 99.8 F (37.7 C) 98.8 F (37.1 C)  TempSrc: Oral Oral Oral Oral  SpO2: 98% 100% 100% 98%  Weight: 85.7 kg (188 lb 15  oz)     Height:        Intake/Output Summary (Last 24 hours) at 02/10/2018 1317 Last data filed at 02/10/2018 [id number] Gross per 24 hour  Intake 300 ml  Output 600 ml  Net -300 ml   Filed Weights   02/07/18 0522 02/08/18 0503 02/09/18 0528  Weight: 78.6 kg (173 lb 4.5 oz) 84.2 kg (185 lb 10 oz) 85.7 kg (188 lb 15 oz)    Exam: In bed, visiting with a friend No increased work of breathing A+Ox3 Right leg with swelling and well de-markated redness and faint streaks up back of calf   Data Reviewed:   I have personally reviewed following labs and imaging studies:  Labs: Labs show the following:   Basic Metabolic Panel: Recent Labs  Lab 02/06/18 1339 02/07/18 0304 02/08/18 0722 02/09/18 0545 02/10/18 0546  NA 139 136 138 140 137  K 3.6 3.1* 3.5 2.9* 4.3  CL 107 106 107 108 108  CO2 22 21* 21* 23 23  GLUCOSE 115* 163* 121* 129* 102*  BUN 15 15 9 6 6   CREATININE 1.10* 0.97 0.81 0.82 0.77  CALCIUM 9.3 8.2* 8.7* 8.4* 8.5*  MG  --   --   --  1.6*  --    GFR Estimated Creatinine Clearance: 68.1 mL/min (by C-G formula based on SCr of 0.77 mg/dL). Liver Function Tests: Recent Labs  Lab 02/06/18 1339 02/07/18 0304  AST 39  31  ALT 21 20  ALKPHOS 75 64  BILITOT 0.4 0.6  PROT 7.3 5.8*  ALBUMIN 3.8 2.9*   No results for input(s): LIPASE, AMYLASE in the last 168 hours. No results for input(s): AMMONIA in the last 168 hours. Coagulation profile No results for input(s): INR, PROTIME in the last 168 hours.  CBC: Recent Labs  Lab 02/06/18 1339 02/07/18 0304 02/08/18 0722 02/09/18 0545 02/10/18 0546  WBC 14.3* 22.6* 27.6* 27.0* 19.6*  NEUTROABS 11.5*  --   --   --   --   HGB 12.4 10.2* 10.0* 10.0* 9.9*  HCT 37.7 30.8* 29.6* 29.2* 29.5*  MCV 77.3* 76.0* 76.1* 75.1* 75.3*  PLT 284 276 279 292 389   Cardiac Enzymes: No results for input(s): CKTOTAL, CKMB, CKMBINDEX, TROPONINI in the last 168 hours. BNP (last 3 results) No results for input(s): PROBNP in the last 8760  hours. CBG: No results for input(s): GLUCAP in the last 168 hours. D-Dimer: No results for input(s): DDIMER in the last 72 hours. Hgb A1c: No results for input(s): HGBA1C in the last 72 hours. Lipid Profile: No results for input(s): CHOL, HDL, LDLCALC, TRIG, CHOLHDL, LDLDIRECT in the last 72 hours. Thyroid function studies: No results for input(s): TSH, T4TOTAL, T3FREE, THYROIDAB in the last 72 hours.  Invalid input(s): FREET3 Anemia work up: Recent Labs    02/10/18 0546  FERRITIN 627*  TIBC 168*  IRON 13*   Sepsis Labs: Recent Labs  Lab 02/06/18 1357 02/06/18 1836 02/07/18 0304 02/08/18 0722 02/09/18 0545 02/10/18 0546  WBC  --   --  22.6* 27.6* 27.0* 19.6*  LATICACIDVEN 1.05 1.67  --   --   --   --     Microbiology Recent Results (from the past 240 hour(s))  Culture, blood (Routine X 2) w Reflex to ID Panel     Status: None (Preliminary result)   Collection Time: 02/06/18  8:40 PM  Result Value Ref Range Status   Specimen Description BLOOD LEFT HAND  Final   Special Requests   Final    BOTTLES DRAWN AEROBIC AND ANAEROBIC Blood Culture adequate volume   Culture   Final    NO GROWTH 3 DAYS Performed at [facility], [address], [place], [place] [address]    Report Status PENDING  Incomplete  Culture, blood (Routine X 2) w Reflex to ID Panel     Status: None (Preliminary result)   Collection Time: 02/06/18  8:47 PM  Result Value Ref Range Status   Specimen Description BLOOD RIGHT ANTECUBITAL  Final   Special Requests   Final    BOTTLES DRAWN AEROBIC AND ANAEROBIC Blood Culture adequate volume   Culture   Final    NO GROWTH 3 DAYS Performed at [facility], [address] [place], [place] [address]    Report Status PENDING  Incomplete    Procedures and diagnostic studies:  Dg Ankle 2 Views Right  Result Date: 02/08/2018 CLINICAL DATA:  72 year old female with right lower extremity cellulitis. EXAM: RIGHT ANKLE - 2 VIEW COMPARISON:  Right  foot series 02/22/2013 and earlier. FINDINGS: Generalized soft tissue swelling. No soft tissue gas. No definite radiopaque foreign body identified. Preserved right mortise joint alignment. No joint effusion identified. No acute osseous abnormality identified. IMPRESSION: Soft tissue swelling with no acute osseous abnormality identified. Electronically Signed   By: Odessa Fleming M.D.   On: 02/08/2018 14:41    Medications:   . atorvastatin  10 mg  Oral q1800  . clopidogrel  75 mg Oral Daily  . docusate sodium  100 mg Oral Daily  . enoxaparin (LOVENOX) injection  40 mg Subcutaneous Q24H  . levothyroxine  88 mcg Oral Once per day on Sun Tue Thu Sat  . lisinopril  5 mg Oral Daily  . sodium chloride flush  3 mL Intravenous Q12H   Continuous Infusions: . sodium chloride    .  ceFAZolin (ANCEF) IV 1 g (02/10/18 1312)  . magnesium sulfate 1 - 4 g bolus IVPB 2 g (02/10/18 1219)     LOS: 2 days   Joseph Art  [facility]   *Please refer to [email or web address], password [email or web address] to get updated schedule on who will round on this patient, as hospitalists switch teams weekly. If 7PM-7AM, please contact night-coverage at [email or web address], password [email or web address] for any overnight needs.  02/10/2018, 1:17 PM

## 2018-02-10 NOTE — Consult Note (Signed)
[facility]    Reason for Consult: RLE Cellulitis/erysipelas    Referring Physician: Dr. Benjamine MolaVann  Principal Problem:   Cellulitis Active Problems:   Fever   Benign essential HTN   Hypothyroidism   . atorvastatin  10 mg Oral q1800  . clopidogrel  75 mg Oral Daily  . docusate sodium  100 mg Oral Daily  . enoxaparin (LOVENOX) injection  40 mg Subcutaneous Q24H  . levothyroxine  88 mcg Oral Once per day on Sun Tue Thu Sat  . lisinopril  5 mg Oral Daily  . sodium chloride flush  3 mL Intravenous Q12H    Assessment/Plan: Erysipelas of right lower extremity: Although appearance has not significantly improved, she does appear to be improving systemically with improved fever curve, leukocytosis, and symptoms.  This is very likely secondary to streptococcal infection.  We will adjust antibiotics as below and if continues to improve tomorrow consider discharging to home on oral antibiotics. -Discontinue vancomycin and ceftriaxone -Start IV cefazolin 1 g every 8 hours -Consider transition to oral cephalexin 500 mg 4 times daily for 3 more days on discharge -She may follow-up with her PCP for reevaluation  Antibiotics: Clindamycin 6/1 Vancomycin 6/1 >> Ceftriaxone 6/1 >>  HPI: Stacey Lambert is a 72 y.o. 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  Patient states she was in her usual state of health until 5/30.  She was outside in her yard when she felt like she was bit on her lower leg.  She did not see any insects, ticks, or bite marks.  She felt well until the night when she began to feel feverish, diaphoretic, with chills.  She felt as if she had the flu.  Her symptoms did not improve the next day, but she noticed erythema at her right lower extremity therefore she presented to the urgent care clinic on 6/1.  She was diagnosed with a soft  skin tissue infection and was offered the option for outpatient treatment with oral antibiotics versus presentation to the emergency department which was her preference.  On arrival she had a low-grade fever leukocytosis of 22.6.  She was given 1 dose of clindamycin and admitted for her right lower extremity cellulitis.  Antibiotics were switched to vancomycin and ceftriaxone.  She had blood cultures drawn on 6/1 which are no growth to date.  X-ray of her right ankle showed soft tissue swelling without bony involvement.  A right lower extremity Doppler ultrasound was negative for DVT.  Her T-max was 101.22F on 6/3 and has been afebrile since.  Her white count peaked at 27k now trending down to 19.6k.  Today patient feels that she has improved a lot with respect to her fevers, diaphoresis, and chills.  She has not noticed much change in the skin of her right lower extremity.  She has not noticed any open sores or drainage from her skin.  Review of Systems: Negative for fevers, chills and sweats. No purulent drainage. All other systems reviewed and are negative    Past Medical History:  Diagnosis Date  . Hyperlipidemia   . Hypertension   . Thyroid disease     Social History   Tobacco Use  . Smoking status: Former Smoker    Types: Cigarettes  . Smokeless tobacco: Never Used  Substance Use Topics  . Alcohol use: Never    Frequency: Never  .  Drug use: Never    Family History  Problem Relation Age of Onset  . Breast cancer Mother     No Known Allergies  Physical Exam: Constitutional: in no apparent distress and well developed and well nourished  Vitals:   02/09/18 2130 02/10/18 0508  BP: 116/67 113/70  Pulse: 91 77  Resp: 17 17  Temp: 99.8 F (37.7 C) 98.8 F (37.1 C)  SpO2: 100% 98%   EYES: anicteric ENMT: Cardiovascular: Cor RRR and 2/6 Early systolic murmur Respiratory: clear; GI: Bowel sounds are normal, liver is not enlarged, spleen is not enlarged Musculoskeletal:  peripheral pulses normal, swelling at the right lower ankle and foot Skin: well-demarcated erythema right ankle about two thirds up to the knee, slightly warm to touch, nontender, no open sores or drainage     Lab Results  Component Value Date   WBC 19.6 (H) 02/10/2018   HGB 9.9 (L) 02/10/2018   HCT 29.5 (L) 02/10/2018   MCV 75.3 (L) 02/10/2018   PLT 389 02/10/2018    Lab Results  Component Value Date   CREATININE 0.77 02/10/2018   BUN 6 02/10/2018   NA 137 02/10/2018   K 4.3 02/10/2018   CL 108 02/10/2018   CO2 23 02/10/2018    Lab Results  Component Value Date   ALT 20 02/07/2018   AST 31 02/07/2018   ALKPHOS 64 02/07/2018     Microbiology: Recent Results (from the past 240 hour(s))  Culture, blood (Routine X 2) w Reflex to ID Panel     Status: None (Preliminary result)   Collection Time: 02/06/18  8:40 PM  Result Value Ref Range Status   Specimen Description BLOOD LEFT HAND  Final   Special Requests   Final    BOTTLES DRAWN AEROBIC AND ANAEROBIC Blood Culture adequate volume   Culture   Final    NO GROWTH 3 DAYS Performed at [facility], [address], [place], [place] [address]    Report Status PENDING  Incomplete  Culture, blood (Routine X 2) w Reflex to ID Panel     Status: None (Preliminary result)   Collection Time: 02/06/18  8:47 PM  Result Value Ref Range Status   Specimen Description BLOOD RIGHT ANTECUBITAL  Final   Special Requests   Final    BOTTLES DRAWN AEROBIC AND ANAEROBIC Blood Culture adequate volume   Culture   Final    NO GROWTH 3 DAYS Performed at [facility], [address] [place], [place] [address]    Report Status PENDING  Incomplete    Darreld Mclean, MD Internal Medicine PGY-3 02/10/2018, 12:00 PM

## 2018-02-11 DIAGNOSIS — I10 Essential (primary) hypertension: Secondary | ICD-10-CM

## 2018-02-11 DIAGNOSIS — L03115 Cellulitis of right lower limb: Principal | ICD-10-CM

## 2018-02-11 LAB — CBC
HCT: 32.6 % — ABNORMAL LOW (ref 36.0–46.0)
HEMOGLOBIN: 11 g/dL — AB (ref 12.0–15.0)
MCH: 25.3 pg — AB (ref 26.0–34.0)
MCHC: 33.7 g/dL (ref 30.0–36.0)
MCV: 75.1 fL — ABNORMAL LOW (ref 78.0–100.0)
Platelets: 497 10*3/uL — ABNORMAL HIGH (ref 150–400)
RBC: 4.34 MIL/uL (ref 3.87–5.11)
RDW: 15.5 % (ref 11.5–15.5)
WBC: 16.1 10*3/uL — ABNORMAL HIGH (ref 4.0–10.5)

## 2018-02-11 LAB — BASIC METABOLIC PANEL
Anion gap: 9 (ref 5–15)
CALCIUM: 9.1 mg/dL (ref 8.9–10.3)
CHLORIDE: 104 mmol/L (ref 101–111)
CO2: 24 mmol/L (ref 22–32)
CREATININE: 0.92 mg/dL (ref 0.44–1.00)
GFR calc Af Amer: >60 mL/min (ref >60)
GFR calc non Af Amer: 60 mL/min — ABNORMAL LOW (ref >60)
Glucose, Bld: 122 mg/dL — ABNORMAL HIGH (ref 65–99)
Potassium: 4 mmol/L (ref 3.5–5.1)
Sodium: 137 mmol/L (ref 135–145)

## 2018-02-11 LAB — CULTURE, BLOOD (ROUTINE X 2)
CULTURE: NO GROWTH
Culture: NO GROWTH
SPECIAL REQUESTS: ADEQUATE
SPECIAL REQUESTS: ADEQUATE

## 2018-02-11 MED ORDER — TRAMADOL HCL 50 MG PO TABS: 50.0000 mg | ORAL_TABLET | Freq: Four times a day (QID) | ORAL | 0 refills | Status: DC | PRN

## 2018-02-11 MED ORDER — CEPHALEXIN 500 MG PO CAPS: 500.0000 mg | ORAL_CAPSULE | Freq: Two times a day (BID) | ORAL | 0 refills | Status: AC

## 2018-02-11 NOTE — Care Management Important Message (Signed)
Important Message  Patient Details  Name: Stacey SongsterJean E Lambert MRN: [id number] Date of Birth: 1945/11/09   Medicare Important Message Given:  Yes    [name] 02/11/2018, 2:30 PM

## 2018-02-11 NOTE — Discharge Summary (Signed)
Physician Discharge Summary  Stacey Lambert VHQ:[id number] DOB: April 06, 1946 DOA: 02/06/2018  PCP: Burton Apley, MD  Admit date: 02/06/2018 Discharge date: 02/11/2018  Admitted From: Home Disposition:  Home  Recommendations for Outpatient Follow-up:  1. Follow up with PCP in 1 week 2. If no improvement, consider referral to Dermatology    Discharge Condition:Improved CODE STATUS:Full Diet recommendation: Regular   Brief/Interim Summary: 72 y.o. female w hypertension, hyperlipidemia, apparently c/o bug bite while working outside on Thursday. Since then has had right lower ext redness from the just above the ankle to about 2/3 up to knee. Has been slow to recover with IV rocephin/vanc-- ID consulted and abx changed to cefazolin with clinical improvement  Fever due to Cellulitis/Erysipelas  Blood culture x2 NGTD Vanc/ceftriaxone changed to cefazolin - well demarcated borders--- most likely strep -elevate extremity -marked boarders -duplex/x ray unconcerning -ID rec for 5 days of keflex on discharge, since signed off -Have pt follow up with PCP on discharge  Leukocytosis -trending down  Hypertension Cont lisinopril 5mg  po qday -hold norvasc and BP on lower side  Hyperlipidemia Cont Lipitor 10mg  po qhs  Hypothyroidism Cont levothyroxine   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    Discharge Diagnoses:  Principal Problem:   Cellulitis Active Problems:   Fever   Benign essential HTN   Hypothyroidism    Discharge Instructions   Allergies as of 02/11/2018   No Known Allergies     Medication List    STOP taking these medications   amLODipine 5 MG tablet Commonly known as:  NORVASC     TAKE these medications   atorvastatin 10 MG tablet Commonly known as:  LIPITOR Take 10 mg by mouth daily at 6 PM.   cephALEXin 500 MG  capsule Commonly known as:  KEFLEX Take 1 capsule (500 mg total) by mouth 2 (two) times daily for 10 days.   clopidogrel 75 MG tablet Commonly known as:  PLAVIX Take 75 mg by mouth daily.   diazepam 5 MG tablet Commonly known as:  VALIUM Take one tablet twice daily as needed for anxiety.   levothyroxine 88 MCG tablet Commonly known as:  SYNTHROID, LEVOTHROID Take 50-88 mcg by mouth daily before breakfast. ON MON WED FRI ON ALL OTHER DAYS   lisinopril 5 MG tablet Commonly known as:  PRINIVIL,ZESTRIL Take 5 mg by mouth daily.   traMADol 50 MG tablet Commonly known as:  ULTRAM Take 1 tablet (50 mg total) by mouth every 6 (six) hours as needed for moderate pain.      Follow-up Information    Burton Apley, MD. Schedule an appointment as soon as possible for a visit in 1 week(s).   Specialty:  Internal Medicine Contact information: [address] [place] [address] [phone]          No Known Allergies  Consultations:  ID  Procedures/Studies: Dg Chest 2 View  Result Date: 02/06/2018 CLINICAL DATA:  Fever. EXAM: CHEST - 2 VIEW COMPARISON:  08/21/2014 and prior exams FINDINGS: Cardiomegaly and mild peribronchial thickening again noted. There is no evidence of focal airspace disease, pulmonary edema, suspicious pulmonary nodule/mass, pleural effusion, or pneumothorax. No acute bony abnormalities are identified. IMPRESSION: Cardiomegaly without evidence of acute cardiopulmonary disease. Electronically Signed   By: Harmon Pier M.D.   On: 02/06/2018 22:35   Dg Ankle 2 Views Right  Result  Date: 02/08/2018 CLINICAL DATA:  72 year old female with right lower extremity cellulitis. EXAM: RIGHT ANKLE - 2 VIEW COMPARISON:  Right foot series 02/22/2013 and earlier. FINDINGS: Generalized soft tissue swelling. No soft tissue gas. No definite radiopaque foreign body identified. Preserved right mortise joint alignment. No joint effusion identified. No acute osseous  abnormality identified. IMPRESSION: Soft tissue swelling with no acute osseous abnormality identified. Electronically Signed   By: Odessa Fleming M.D.   On: 02/08/2018 14:41     Subjective: Eager to go home  Discharge Exam: Vitals:   02/10/18 2047 02/11/18 0625  BP: 132/78 (!) 96/57  Pulse: (!) 104 81  Resp: 17 16  Temp: 99.3 F (37.4 C) 99.1 F (37.3 C)  SpO2: 98% 97%   Vitals:   02/10/18 1352 02/10/18 2047 02/11/18 0625 02/11/18 0700  BP: 127/67 132/78 (!) 96/57   Pulse: 89 (!) 104 81   Resp:  17 16   Temp: 98.7 F (37.1 C) 99.3 F (37.4 C) 99.1 F (37.3 C)   TempSrc: Oral Oral Oral   SpO2: 100% 98% 97%   Weight:    84.6 kg (186 lb 8.2 oz)  Height:        General: Pt is alert, awake, not in acute distress Cardiovascular: RRR, S1/S2 +, no rubs, no gallops Respiratory: CTA bilaterally, no wheezing, no rhonchi Abdominal: Soft, NT, ND, bowel sounds + Extremities: no edema, no cyanosis   The results of significant diagnostics from this hospitalization (including imaging, microbiology, ancillary and laboratory) are listed below for reference.     Microbiology: Recent Results (from the past 240 hour(s))  Culture, blood (Routine X 2) w Reflex to ID Panel     Status: None (Preliminary result)   Collection Time: 02/06/18  8:40 PM  Result Value Ref Range Status   Specimen Description BLOOD LEFT HAND  Final   Special Requests   Final    BOTTLES DRAWN AEROBIC AND ANAEROBIC Blood Culture adequate volume   Culture   Final    NO GROWTH 4 DAYS Performed at [facility], [address] [place], [place] [address]    Report Status PENDING  Incomplete  Culture, blood (Routine X 2) w Reflex to ID Panel     Status: None (Preliminary result)   Collection Time: 02/06/18  8:47 PM  Result Value Ref Range Status   Specimen Description BLOOD RIGHT ANTECUBITAL  Final   Special Requests   Final    BOTTLES DRAWN AEROBIC AND ANAEROBIC Blood Culture adequate volume   Culture   Final     NO GROWTH 4 DAYS Performed at [facility], [address], [place], [place] [address]    Report Status PENDING  Incomplete     Labs: BNP (last 3 results) No results for input(s): BNP in the last 8760 hours. Basic Metabolic Panel: Recent Labs  Lab 02/07/18 0304 02/08/18 0722 02/09/18 0545 02/10/18 0546 02/11/18 0804  NA 136 138 140 137 137  K 3.1* 3.5 2.9* 4.3 4.0  CL 106 107 108 108 104  CO2 21* 21* 23 23 24   GLUCOSE 163* 121* 129* 102* 122*  BUN 15 9 6 6  <5*  CREATININE 0.97 0.81 0.82 0.77 0.92  CALCIUM 8.2* 8.7* 8.4* 8.5* 9.1  MG  --   --  1.6*  --   --    Liver Function Tests: Recent Labs  Lab 02/06/18 1339 02/07/18 0304  AST 39 31  ALT 21 20  ALKPHOS 75 64  BILITOT 0.4 0.6  PROT 7.3 5.8*  ALBUMIN 3.8 2.9*   No results for input(s): LIPASE, AMYLASE in the last 168 hours. No results for input(s): AMMONIA in the last 168 hours. CBC: Recent Labs  Lab 02/06/18 1339 02/07/18 0304 02/08/18 0722 02/09/18 0545 02/10/18 0546 02/11/18 0804  WBC 14.3* 22.6* 27.6* 27.0* 19.6* 16.1*  NEUTROABS 11.5*  --   --   --   --   --   HGB 12.4 10.2* 10.0* 10.0* 9.9* 11.0*  HCT 37.7 30.8* 29.6* 29.2* 29.5* 32.6*  MCV 77.3* 76.0* 76.1* 75.1* 75.3* 75.1*  PLT 284 276 279 292 389 497*   Cardiac Enzymes: No results for input(s): CKTOTAL, CKMB, CKMBINDEX, TROPONINI in the last 168 hours. BNP: Invalid input(s): POCBNP CBG: No results for input(s): GLUCAP in the last 168 hours. D-Dimer No results for input(s): DDIMER in the last 72 hours. Hgb A1c No results for input(s): HGBA1C in the last 72 hours. Lipid Profile No results for input(s): CHOL, HDL, LDLCALC, TRIG, CHOLHDL, LDLDIRECT in the last 72 hours. Thyroid function studies No results for input(s): TSH, T4TOTAL, T3FREE, THYROIDAB in the last 72 hours.  Invalid input(s): FREET3 Anemia work up Recent Labs    02/10/18 0546  FERRITIN 627*  TIBC 168*  IRON 13*   Urinalysis    Component Value Date/Time    COLORURINE YELLOW 02/06/2018 2101   APPEARANCEUR HAZY (A) 02/06/2018 2101   LABSPEC 1.010 02/06/2018 2101   PHURINE 5.0 02/06/2018 2101   GLUCOSEU NEGATIVE 02/06/2018 2101   HGBUR SMALL (A) 02/06/2018 2101   BILIRUBINUR NEGATIVE 02/06/2018 2101   KETONESUR NEGATIVE 02/06/2018 2101   PROTEINUR NEGATIVE 02/06/2018 2101   UROBILINOGEN 0.2 11/06/2009 1006   NITRITE NEGATIVE 02/06/2018 2101   LEUKOCYTESUR NEGATIVE 02/06/2018 2101   Sepsis Labs Invalid input(s): PROCALCITONIN,  WBC,  LACTICIDVEN Microbiology Recent Results (from the past 240 hour(s))  Culture, blood (Routine X 2) w Reflex to ID Panel     Status: None (Preliminary result)   Collection Time: 02/06/18  8:40 PM  Result Value Ref Range Status   Specimen Description BLOOD LEFT HAND  Final   Special Requests   Final    BOTTLES DRAWN AEROBIC AND ANAEROBIC Blood Culture adequate volume   Culture   Final    NO GROWTH 4 DAYS Performed at [facility], [address] [place], [place] [address]    Report Status PENDING  Incomplete  Culture, blood (Routine X 2) w Reflex to ID Panel     Status: None (Preliminary result)   Collection Time: 02/06/18  8:47 PM  Result Value Ref Range Status   Specimen Description BLOOD RIGHT ANTECUBITAL  Final   Special Requests   Final    BOTTLES DRAWN AEROBIC AND ANAEROBIC Blood Culture adequate volume   Culture   Final    NO GROWTH 4 DAYS Performed at [facility], [address] [place], [place] [address]    Report Status PENDING  Incomplete   Time spent 30min  SIGNED:   Rickey BarbaraStephen Chiu, MD  [facility] 02/11/2018, 1:17 PM  If 7PM-7AM, please contact night-coverage [email or web address] Password [email or web address]

## 2018-02-11 NOTE — Progress Notes (Signed)
    [facility]   Reason for visit: Follow up on cellulitis/erysipelas  Interval History: a new blister area, no fever, no chills, WBC decreasing. No associated rash, diarrhea.   Physical Exam: Constitutional:  Vitals:   02/10/18 2047 02/11/18 0625  BP: 132/78 (!) 96/57  Pulse: (!) 104 81  Resp: 17 16  Temp: 99.3 F (37.4 C) 99.1 F (37.3 C)  SpO2: 98% 97%   patient appears in NAD Respiratory: Normal respiratory effort; CTA B Cardiovascular: RRR GI: soft, nt, nd MS: right leg with same area of erythema, raised, some warmth; new blister area.    Review of Systems: Constitutional: negative for fevers and chills Gastrointestinal: negative for diarrhea Integument/breast: negative for rash  Lab Results  Component Value Date   WBC 16.1 (H) 02/11/2018   HGB 11.0 (L) 02/11/2018   HCT 32.6 (L) 02/11/2018   MCV 75.1 (L) 02/11/2018   PLT 497 (H) 02/11/2018    Lab Results  Component Value Date   CREATININE 0.92 02/11/2018   BUN <5 (L) 02/11/2018   NA 137 02/11/2018   K 4.0 02/11/2018   CL 104 02/11/2018   CO2 24 02/11/2018    Lab Results  Component Value Date   ALT 20 02/07/2018   AST 31 02/07/2018   ALKPHOS 64 02/07/2018     Microbiology: Recent Results (from the past 240 hour(s))  Culture, blood (Routine X 2) w Reflex to ID Panel     Status: None (Preliminary result)   Collection Time: 02/06/18  8:40 PM  Result Value Ref Range Status   Specimen Description BLOOD LEFT HAND  Final   Special Requests   Final    BOTTLES DRAWN AEROBIC AND ANAEROBIC Blood Culture adequate volume   Culture   Final    NO GROWTH 4 DAYS Performed at [facility], [address] [place], [place] [address]    Report Status PENDING  Incomplete  Culture, blood (Routine X 2) w Reflex to ID Panel     Status: None (Preliminary result)   Collection Time: 02/06/18  8:47 PM  Result Value Ref Range Status   Specimen Description BLOOD RIGHT ANTECUBITAL  Final   Special Requests   Final    BOTTLES DRAWN AEROBIC AND ANAEROBIC Blood Culture adequate volume   Culture   Final    NO GROWTH 4 DAYS Performed at [facility], [address] [place], [place] [address]    Report Status PENDING  Incomplete    Impression/Plan:  1. Erysipelas - improving.  On cefazolin.  Can go home on 5 days of oral keflex.  She already has follow up with her PCP next week.  If rash persists, may need to see dermatology for biopsy but that is not expected.  2. Leukocytosis - improving and down to 16.  I will sign off, thanks for consultation.

## 2018-02-11 NOTE — Progress Notes (Signed)
Pt for discharge going home health teachings, prescription, next appointment, discontinued peripheral IV line, family at the bedside, given all her personal belongings, no complain of pain at this time.

## 2018-06-01 ENCOUNTER — Other Ambulatory Visit: Payer: Self-pay | Admitting: Internal Medicine

## 2018-06-01 DIAGNOSIS — Z1231 Encounter for screening mammogram for malignant neoplasm of breast: Secondary | ICD-10-CM

## 2018-07-06 ENCOUNTER — Ambulatory Visit
Admission: RE | Admit: 2018-07-06 | Discharge: 2018-07-06 | Disposition: A | Discharge status: Home / Self Care | Payer: Medicare HMO | Source: Ambulatory Visit | Attending: Internal Medicine | Admitting: Internal Medicine

## 2018-07-06 DIAGNOSIS — Z1231 Encounter for screening mammogram for malignant neoplasm of breast: Secondary | ICD-10-CM

## 2018-11-01 ENCOUNTER — Other Ambulatory Visit: Payer: Self-pay | Admitting: Internal Medicine

## 2018-11-01 ENCOUNTER — Ambulatory Visit
Admission: RE | Admit: 2018-11-01 | Discharge: 2018-11-01 | Disposition: A | Discharge status: Home / Self Care | Payer: Medicare HMO | Source: Ambulatory Visit | Attending: Internal Medicine | Admitting: Internal Medicine

## 2018-11-01 DIAGNOSIS — M545 Low back pain, unspecified: Secondary | ICD-10-CM

## 2018-11-01 DIAGNOSIS — M25532 Pain in left wrist: Secondary | ICD-10-CM

## 2018-11-08 ENCOUNTER — Other Ambulatory Visit: Payer: Self-pay | Admitting: Internal Medicine

## 2018-11-08 ENCOUNTER — Other Ambulatory Visit (HOSPITAL_COMMUNITY): Payer: Self-pay | Admitting: Internal Medicine

## 2018-11-08 DIAGNOSIS — R937 Abnormal findings on diagnostic imaging of other parts of musculoskeletal system: Secondary | ICD-10-CM

## 2018-11-15 ENCOUNTER — Encounter (HOSPITAL_COMMUNITY)
Admission: RE | Admit: 2018-11-15 | Discharge: 2018-11-15 | Disposition: A | Discharge status: Home / Self Care | Payer: Medicare HMO | Source: Ambulatory Visit | Attending: Internal Medicine | Admitting: Internal Medicine

## 2018-11-15 DIAGNOSIS — R937 Abnormal findings on diagnostic imaging of other parts of musculoskeletal system: Secondary | ICD-10-CM | POA: Insufficient documentation

## 2018-11-15 MED ORDER — TECHNETIUM TC 99M MEDRONATE IV KIT
21.1000 | PACK | Freq: Once | INTRAVENOUS | Status: AC | PRN
  Administered 2018-11-15: 21.1 via INTRAVENOUS

## 2019-06-07 ENCOUNTER — Other Ambulatory Visit: Payer: Self-pay | Admitting: Internal Medicine

## 2019-06-07 DIAGNOSIS — Z1231 Encounter for screening mammogram for malignant neoplasm of breast: Secondary | ICD-10-CM

## 2019-07-22 ENCOUNTER — Ambulatory Visit
Admission: RE | Admit: 2019-07-22 | Discharge: 2019-07-22 | Disposition: A | Discharge status: Home / Self Care | Payer: Medicare HMO | Source: Ambulatory Visit | Attending: Internal Medicine | Admitting: Internal Medicine

## 2019-07-22 ENCOUNTER — Other Ambulatory Visit: Payer: Self-pay

## 2019-07-22 DIAGNOSIS — Z1231 Encounter for screening mammogram for malignant neoplasm of breast: Secondary | ICD-10-CM

## 2020-06-12 ENCOUNTER — Other Ambulatory Visit: Payer: Self-pay | Admitting: Internal Medicine

## 2020-06-12 DIAGNOSIS — Z1231 Encounter for screening mammogram for malignant neoplasm of breast: Secondary | ICD-10-CM

## 2020-07-06 IMAGING — CR DG WRIST COMPLETE 3+V*L*
4 series · 4 of 4 positions shown · non-contrast
Comparison: No recent prior.

CLINICAL DATA: Chronic pain.  No injury.

EXAM:
LEFT WRIST - COMPLETE 3+ VIEW

[x wrist pa left]
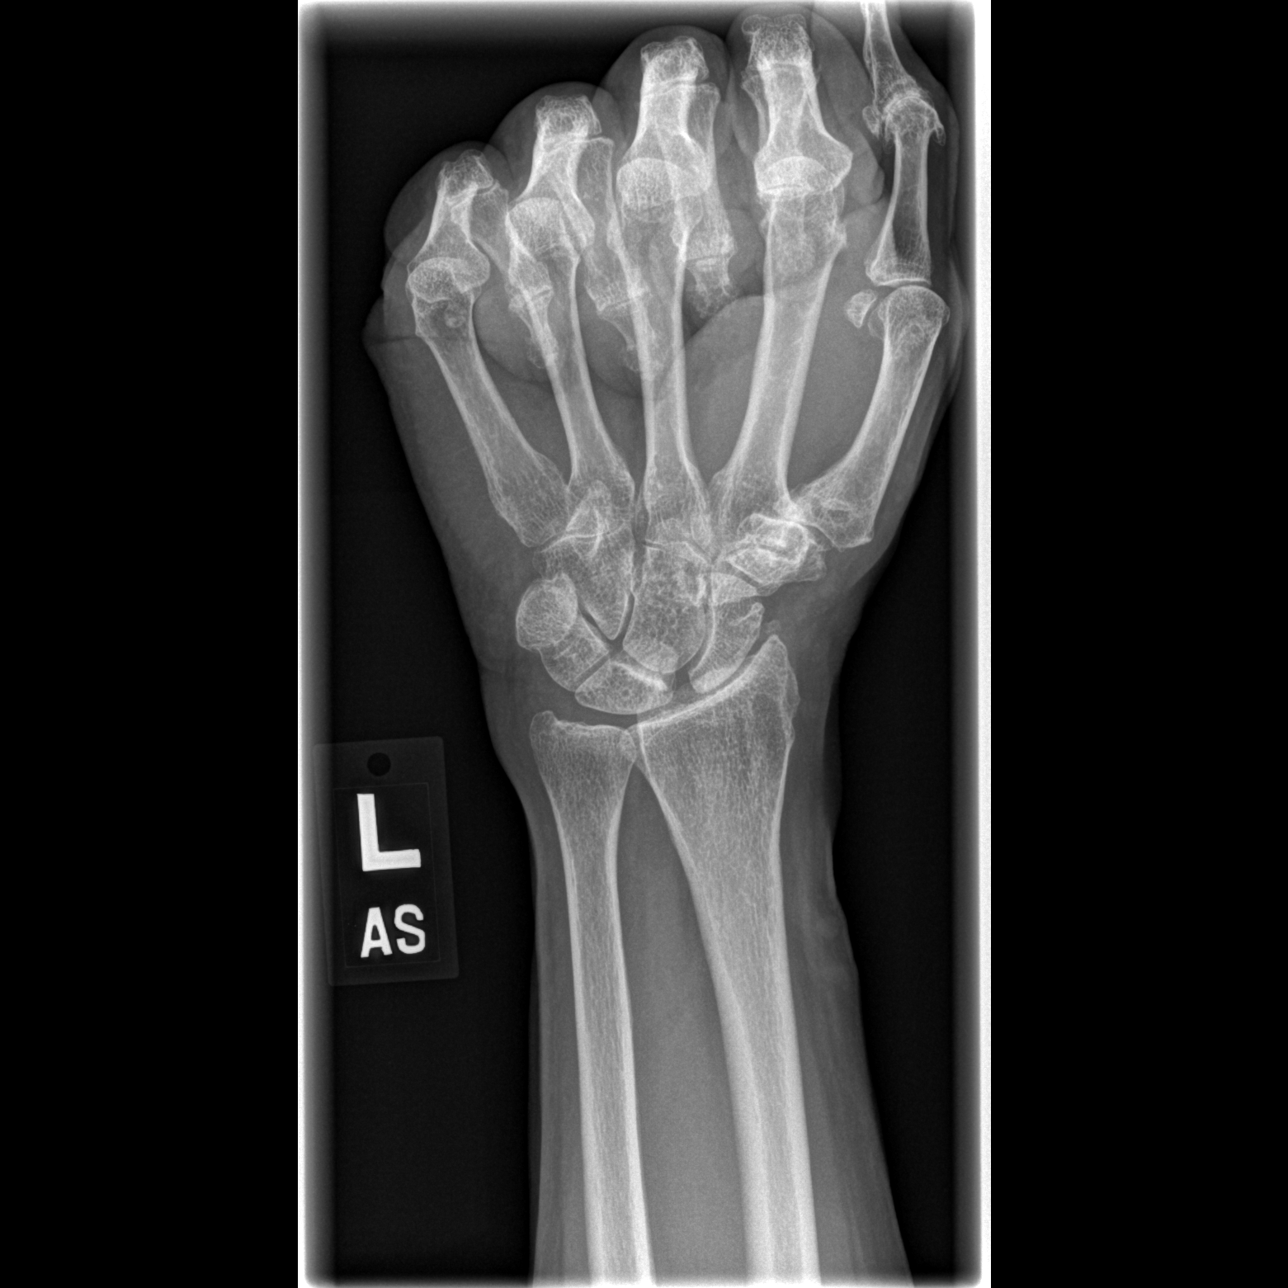

[x wrist obl left]
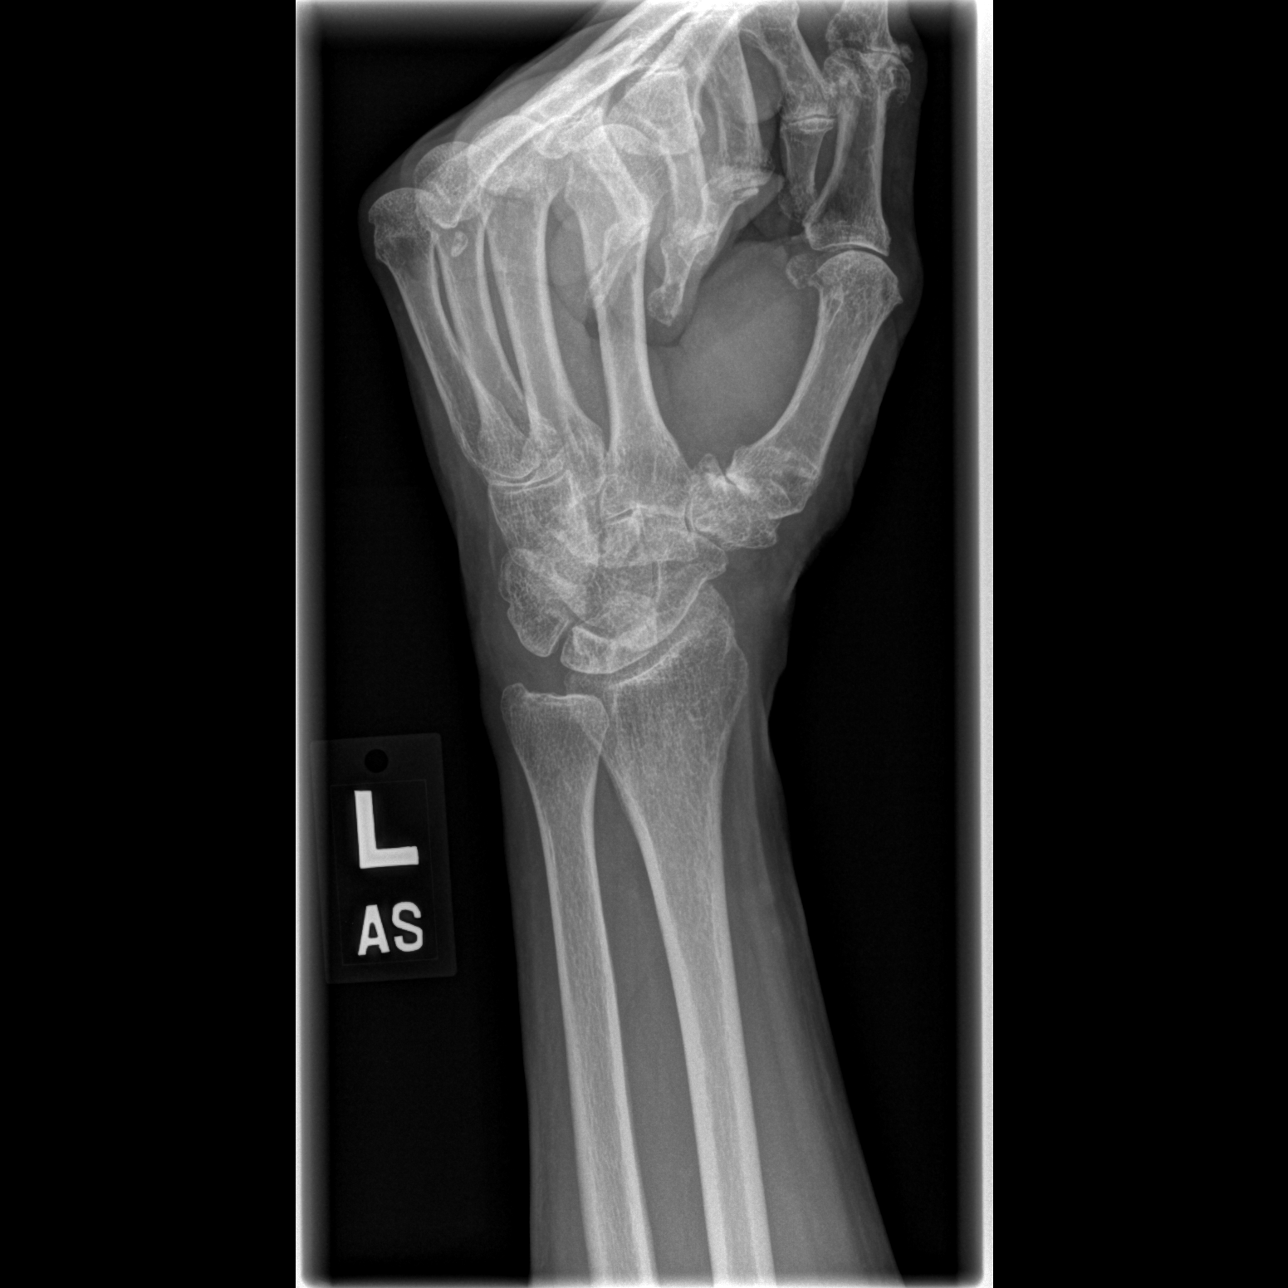

[x wrist lat left]
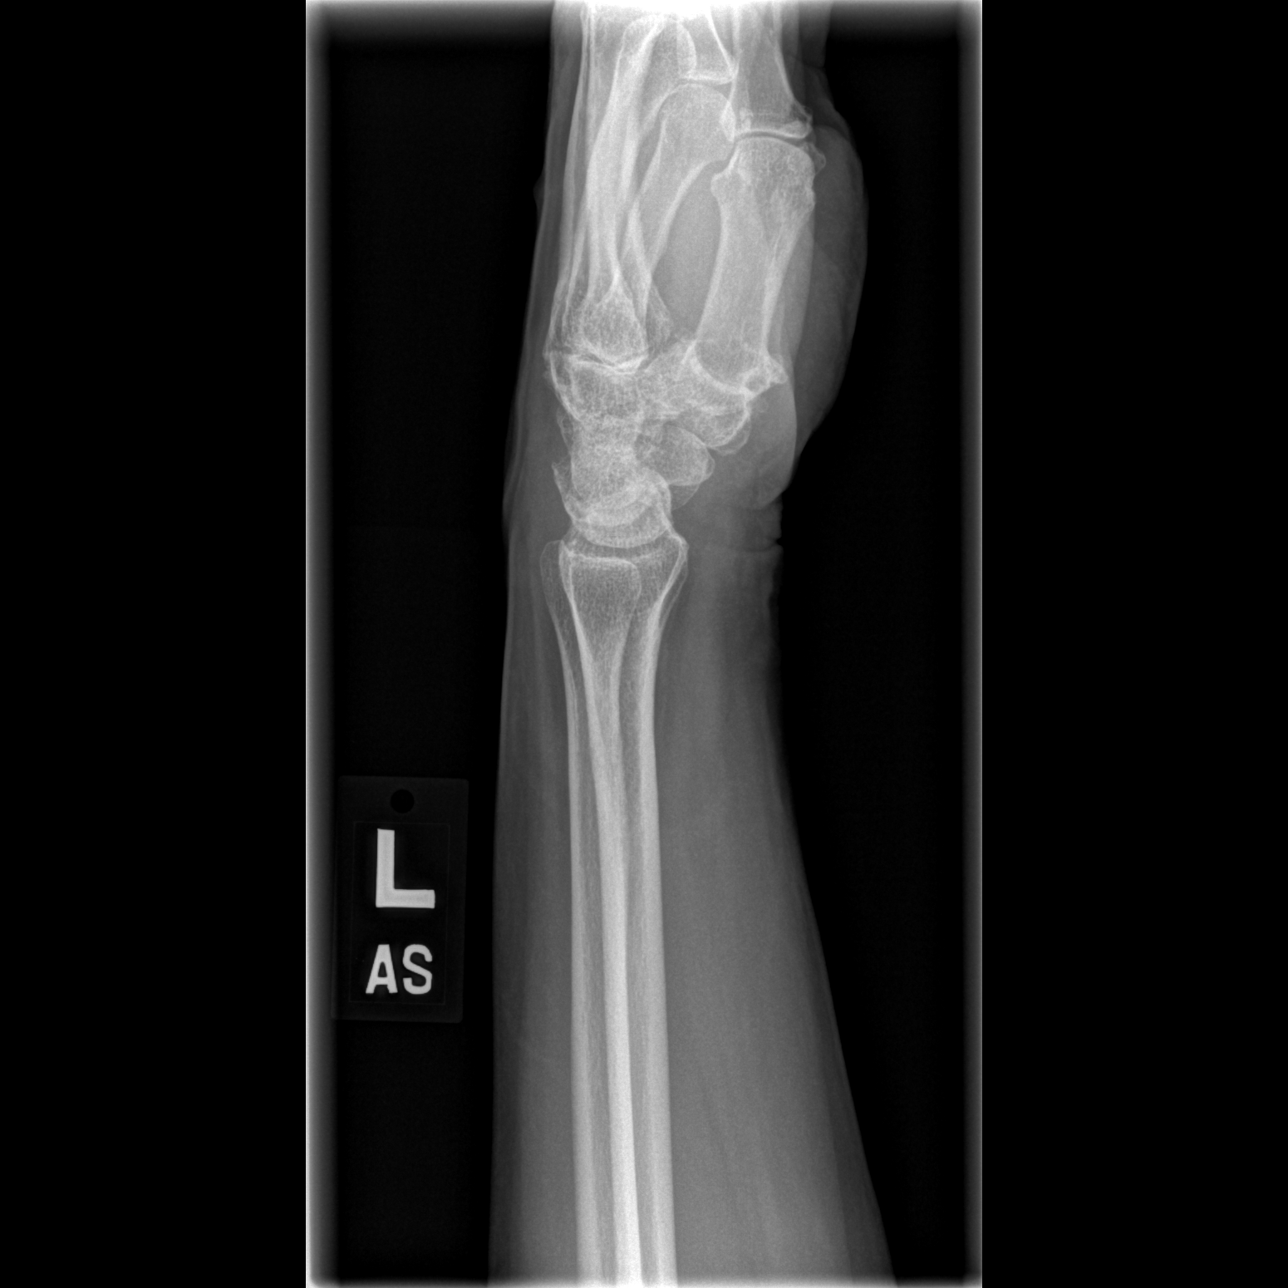

[x navicular]
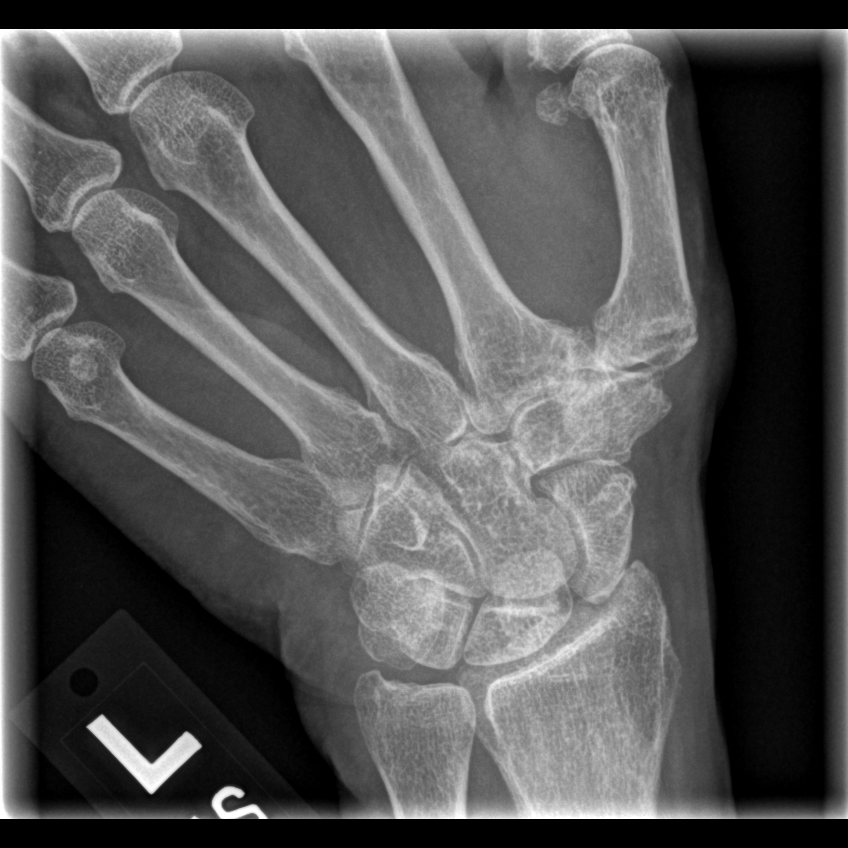

[4 of 4 positions shown; findings below may reference images not displayed]

FINDINGS: Diffuse osteopenia and degenerative change. Corticated bony density
noted adjacent to the radius styloid, most likely old fracture
fragment. Mild prominence of the scapholunate space. Scapholunate
dissociation can not be excluded.
IMPRESSION: 1. Diffuse osteopenia degenerative change. Corticated bony density
noted adjacent to the radial styloid. This may represent old
fracture fragment. No evidence of acute fracture.

2. Mild prominence of the scapholunate space. Scapholunate
dissociation can not be excluded.

## 2020-07-23 ENCOUNTER — Ambulatory Visit: Payer: Medicare HMO

## 2020-08-16 ENCOUNTER — Other Ambulatory Visit: Payer: Self-pay

## 2020-08-16 ENCOUNTER — Ambulatory Visit
Admission: RE | Admit: 2020-08-16 | Discharge: 2020-08-16 | Disposition: A | Discharge status: Home / Self Care | Payer: Medicare Other | Source: Ambulatory Visit | Attending: Internal Medicine | Admitting: Internal Medicine

## 2020-08-16 DIAGNOSIS — Z1231 Encounter for screening mammogram for malignant neoplasm of breast: Secondary | ICD-10-CM

## 2021-07-08 ENCOUNTER — Other Ambulatory Visit: Payer: Self-pay | Admitting: Internal Medicine

## 2021-07-08 DIAGNOSIS — Z1231 Encounter for screening mammogram for malignant neoplasm of breast: Secondary | ICD-10-CM

## 2021-08-19 ENCOUNTER — Other Ambulatory Visit: Payer: Self-pay

## 2021-08-19 ENCOUNTER — Ambulatory Visit: Payer: Medicare Other

## 2021-08-19 ENCOUNTER — Ambulatory Visit
Admission: RE | Admit: 2021-08-19 | Discharge: 2021-08-19 | Disposition: A | Discharge status: Home / Self Care | Payer: Medicare Other | Source: Ambulatory Visit | Attending: Internal Medicine | Admitting: Internal Medicine

## 2021-08-19 DIAGNOSIS — Z1231 Encounter for screening mammogram for malignant neoplasm of breast: Secondary | ICD-10-CM

## 2021-10-08 ENCOUNTER — Ambulatory Visit
Admission: RE | Admit: 2021-10-08 | Discharge: 2021-10-08 | Disposition: A | Discharge status: Home / Self Care | Payer: Medicare Other | Source: Ambulatory Visit | Attending: Internal Medicine | Admitting: Internal Medicine

## 2021-10-08 ENCOUNTER — Other Ambulatory Visit: Payer: Self-pay | Admitting: Internal Medicine

## 2021-10-08 DIAGNOSIS — M25551 Pain in right hip: Secondary | ICD-10-CM

## 2022-07-11 ENCOUNTER — Other Ambulatory Visit: Payer: Self-pay | Admitting: Internal Medicine

## 2022-07-11 DIAGNOSIS — Z1231 Encounter for screening mammogram for malignant neoplasm of breast: Secondary | ICD-10-CM

## 2022-09-11 ENCOUNTER — Ambulatory Visit
Admission: RE | Admit: 2022-09-11 | Discharge: 2022-09-11 | Disposition: A | Discharge status: Home / Self Care | Payer: Medicare Other | Source: Ambulatory Visit | Attending: Internal Medicine | Admitting: Internal Medicine

## 2022-09-11 DIAGNOSIS — Z1231 Encounter for screening mammogram for malignant neoplasm of breast: Secondary | ICD-10-CM

## 2023-01-07 ENCOUNTER — Ambulatory Visit: Payer: Medicare Other | Admitting: Podiatry

## 2023-01-07 ENCOUNTER — Encounter: Payer: Self-pay | Admitting: Podiatry

## 2023-01-07 ENCOUNTER — Ambulatory Visit: Payer: Medicare Other

## 2023-01-07 DIAGNOSIS — M25471 Effusion, right ankle: Secondary | ICD-10-CM

## 2023-01-07 DIAGNOSIS — M5416 Radiculopathy, lumbar region: Secondary | ICD-10-CM

## 2023-01-07 NOTE — Progress Notes (Signed)
  Subjective:  Patient ID: Stacey Lambert, female    DOB: July 18, 1946,   MRN: [id number]  Chief Complaint  Patient presents with   Foot Problem    Patient concerned about 2nd toe on right foot numbness. Patient stated she has balance problem due to the numbness of toe.    Joint Swelling    Patient concerned about right ankle swelling. Patient uses compression socks sometimes.    77 y.o. female presents for concern of right foot pain and ankle swelling that has been going on for years. Relates at the end of the day she will get swelling just in the right ankle. Relates previous bunion surgery and since then has had some issues bending her second toe and recently started having some numbness in the toe and all along the bottom of her foot. Relates she also gets tingling down her right side from her hip. 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   . Denies any other pedal complaints. Denies n/v/f/c.   Past Medical History:  Diagnosis Date   Hyperlipidemia    Hypertension    Thyroid disease     Objective:  Physical Exam: Vascular: DP/PT pulses 2/4 bilateral. CFT <3 seconds. Normal hair growth on digits. No edema.  Skin. No lacerations or abrasions bilateral feet. Cicatrix from previous bunion surgery.  Musculoskeletal: MMT 5/5 bilateral lower extremities in DF, PF, Inversion and Eversion. Deceased ROM in DF of ankle joint. No pain to palpation about the foot. Hammered second through fourth digits on the right. Reducible. No pain to palpation.  Neurological: Sensation intact to light touch. Protective sensation intact.   Assessment:   1. Right ankle swelling   2. Lumbar radiculopathy      Plan:  Patient was evaluated and treated and all questions answered. X-rays reviewed and discussed with patient. No acute fractures or dislocations noted. Retained hardware from previous bunion surgery. Midfoot degeneration noted on the right  with some changes noted to the subtalar joint as well. Some posterior calcaneal spurring.  Discussed radiculopathy vs neuropathy vs neuritis  and etiology as well as treatment with patient. Most likely this is coming from her back as unilateral in nature.  Radiographs reviewed and discussed with patient.  -Discussed supportive shoes at all times and checking feet regularly.  -Follow-up with specialist for back pain.  Discussed PT to help with balance issues. Currently in PT and will try to add this on.  -Discussed swelling and elevating and using compression.  -Patient to return as needed. Louann Sjogren, DPM

## 2023-08-11 ENCOUNTER — Other Ambulatory Visit: Payer: Self-pay | Admitting: Internal Medicine

## 2023-08-11 DIAGNOSIS — Z0000 Encounter for general adult medical examination without abnormal findings: Secondary | ICD-10-CM

## 2023-09-15 ENCOUNTER — Ambulatory Visit
Admission: RE | Admit: 2023-09-15 | Discharge: 2023-09-15 | Disposition: A | Discharge status: Home / Self Care | Payer: Medicare Other | Source: Ambulatory Visit | Attending: Internal Medicine | Admitting: Internal Medicine

## 2023-09-15 DIAGNOSIS — Z0000 Encounter for general adult medical examination without abnormal findings: Secondary | ICD-10-CM

## 2024-02-26 ENCOUNTER — Encounter: Payer: Self-pay | Admitting: Family

## 2024-08-23 ENCOUNTER — Other Ambulatory Visit: Payer: Self-pay | Admitting: Family

## 2024-08-23 DIAGNOSIS — Z1231 Encounter for screening mammogram for malignant neoplasm of breast: Secondary | ICD-10-CM

## 2024-08-27 ENCOUNTER — Other Ambulatory Visit: Payer: Self-pay

## 2024-08-27 ENCOUNTER — Emergency Department (HOSPITAL_COMMUNITY)

## 2024-08-27 ENCOUNTER — Inpatient Hospital Stay (HOSPITAL_COMMUNITY)
Admission: EM | Admit: 2024-08-27 | Discharge: 2024-08-30 | DRG: 482 | Disposition: A | Discharge status: Home Health | Attending: Family Medicine | Admitting: Family Medicine

## 2024-08-27 ENCOUNTER — Encounter (HOSPITAL_COMMUNITY): Payer: Self-pay

## 2024-08-27 DIAGNOSIS — G629 Polyneuropathy, unspecified: Secondary | ICD-10-CM | POA: Diagnosis present

## 2024-08-27 DIAGNOSIS — E785 Hyperlipidemia, unspecified: Secondary | ICD-10-CM | POA: Diagnosis present

## 2024-08-27 DIAGNOSIS — S72001A Fracture of unspecified part of neck of right femur, initial encounter for closed fracture: Principal | ICD-10-CM

## 2024-08-27 DIAGNOSIS — M5416 Radiculopathy, lumbar region: Secondary | ICD-10-CM | POA: Diagnosis present

## 2024-08-27 DIAGNOSIS — S72009A Fracture of unspecified part of neck of unspecified femur, initial encounter for closed fracture: Secondary | ICD-10-CM | POA: Diagnosis present

## 2024-08-27 DIAGNOSIS — W010XXA Fall on same level from slipping, tripping and stumbling without subsequent striking against object, initial encounter: Secondary | ICD-10-CM | POA: Diagnosis present

## 2024-08-27 DIAGNOSIS — S72144A Nondisplaced intertrochanteric fracture of right femur, initial encounter for closed fracture: Principal | ICD-10-CM | POA: Diagnosis present

## 2024-08-27 DIAGNOSIS — E039 Hypothyroidism, unspecified: Secondary | ICD-10-CM | POA: Diagnosis present

## 2024-08-27 DIAGNOSIS — I10 Essential (primary) hypertension: Secondary | ICD-10-CM | POA: Diagnosis present

## 2024-08-27 DIAGNOSIS — Z79899 Other long term (current) drug therapy: Secondary | ICD-10-CM

## 2024-08-27 DIAGNOSIS — H538 Other visual disturbances: Secondary | ICD-10-CM | POA: Diagnosis not present

## 2024-08-27 DIAGNOSIS — Z79890 Hormone replacement therapy (postmenopausal): Secondary | ICD-10-CM

## 2024-08-27 DIAGNOSIS — I252 Old myocardial infarction: Secondary | ICD-10-CM

## 2024-08-27 DIAGNOSIS — Z7982 Long term (current) use of aspirin: Secondary | ICD-10-CM

## 2024-08-27 DIAGNOSIS — Z87891 Personal history of nicotine dependence: Secondary | ICD-10-CM

## 2024-08-27 DIAGNOSIS — Z7902 Long term (current) use of antithrombotics/antiplatelets: Secondary | ICD-10-CM

## 2024-08-27 DIAGNOSIS — Z90710 Acquired absence of both cervix and uterus: Secondary | ICD-10-CM

## 2024-08-27 DIAGNOSIS — I2510 Atherosclerotic heart disease of native coronary artery without angina pectoris: Secondary | ICD-10-CM | POA: Diagnosis present

## 2024-08-27 LAB — BASIC METABOLIC PANEL WITH GFR
Anion gap: 11 (ref 5–15)
BUN: 10 mg/dL (ref 8–23)
CO2: 22 mmol/L (ref 22–32)
Calcium: 9.2 mg/dL (ref 8.9–10.3)
Chloride: 107 mmol/L (ref 98–111)
Creatinine, Ser: 0.47 mg/dL (ref 0.44–1.00)
GFR, Estimated: >60 mL/min
Glucose, Bld: 103 mg/dL — ABNORMAL HIGH (ref 70–99)
Potassium: 4 mmol/L (ref 3.5–5.1)
Sodium: 140 mmol/L (ref 135–145)

## 2024-08-27 LAB — CBC
HCT: 35.3 % — ABNORMAL LOW (ref 36.0–46.0)
Hemoglobin: 11.7 g/dL — ABNORMAL LOW (ref 12.0–15.0)
MCH: 26.8 pg (ref 26.0–34.0)
MCHC: 33.1 g/dL (ref 30.0–36.0)
MCV: 81 fL (ref 80.0–100.0)
Platelets: 321 K/uL (ref 150–400)
RBC: 4.36 MIL/uL (ref 3.87–5.11)
RDW: 16.9 % — ABNORMAL HIGH (ref 11.5–15.5)
WBC: 8.4 K/uL (ref 4.0–10.5)
nRBC: 0 % (ref 0.0–0.2)

## 2024-08-27 MED ORDER — HYDROMORPHONE HCL 1 MG/ML IJ SOLN
0.5000 mg | INTRAMUSCULAR | Status: DC | PRN
  Administered 2024-08-28: 0.5 mg via INTRAVENOUS
  Filled 2024-08-27: qty 0.5

## 2024-08-27 MED ORDER — LIDOCAINE 5 % EX PTCH
1.0000 | MEDICATED_PATCH | CUTANEOUS | Status: DC
  Administered 2024-08-27 – 2024-08-29 (×3): 1 via TRANSDERMAL
  Filled 2024-08-27 (×3): qty 1

## 2024-08-27 MED ORDER — GABAPENTIN 100 MG PO CAPS
100.0000 mg | ORAL_CAPSULE | Freq: Three times a day (TID) | ORAL | Status: DC
  Administered 2024-08-28 – 2024-08-30 (×8): 100 mg via ORAL
  Filled 2024-08-27 (×8): qty 1

## 2024-08-27 MED ORDER — OXYCODONE HCL 5 MG PO TABS
5.0000 mg | ORAL_TABLET | ORAL | Status: DC | PRN
  Administered 2024-08-30: 5 mg via ORAL
  Filled 2024-08-27 (×2): qty 1

## 2024-08-27 MED ORDER — ACETAMINOPHEN 325 MG PO TABS
975.0000 mg | ORAL_TABLET | Freq: Three times a day (TID) | ORAL | Status: DC
  Administered 2024-08-28 – 2024-08-30 (×8): 975 mg via ORAL
  Filled 2024-08-27 (×8): qty 3

## 2024-08-27 MED ORDER — MORPHINE SULFATE (PF) 4 MG/ML IV SOLN
4.0000 mg | Freq: Once | INTRAVENOUS | Status: AC
  Administered 2024-08-27: 4 mg via INTRAVENOUS
  Filled 2024-08-27: qty 1

## 2024-08-27 MED ORDER — ACETAMINOPHEN 325 MG PO TABS
650.0000 mg | ORAL_TABLET | Freq: Four times a day (QID) | ORAL | Status: AC | PRN
  Administered 2024-08-27: 650 mg via ORAL
  Filled 2024-08-27: qty 2

## 2024-08-27 MED ORDER — OXYCODONE HCL 5 MG PO TABS
5.0000 mg | ORAL_TABLET | ORAL | Status: AC
  Administered 2024-08-27: 5 mg via ORAL
  Filled 2024-08-27: qty 1

## 2024-08-27 MED ORDER — OXYCODONE HCL 5 MG PO TABS
10.0000 mg | ORAL_TABLET | ORAL | Status: DC | PRN
  Administered 2024-08-28 – 2024-08-30 (×4): 10 mg via ORAL
  Filled 2024-08-27 (×4): qty 2

## 2024-08-27 NOTE — ED Provider Notes (Signed)
" °  Physical Exam  BP (!) 169/77 (BP Location: Right Arm)   Pulse 66   Temp 98.3 F (36.8 C) (Oral)   Resp 20   Ht 5' 5 (1.651 m)   Wt 62.1 kg   SpO2 98%   BMI 22.80 kg/m   Physical Exam  Procedures  Procedures  ED Course / MDM   Clinical Course as of 08/27/24 1600  Sat Aug 27, 2024  1307 Plain film x-rays do not show acute fracture in spine or pelvis [JK]  1417 CT PELVIS WO CONTRAST CT scan does show right proximal femur fracture [JK]  1523 Case discussed with Dr Teresa,  reviewed films.  DOes not feel pt needs surgery.  Can do protected weight bearing.  But would recommend MRI to make sure it does not extend and is only in greater  [JK]  1558 Assumed care from Dr Randol. 78 yo F who presented with fall and has greater trochanteric hip fracture. Dw ortho who feels likely non-operative. Can be protective weight bearing. They did say they wanted an MRI to be sure it didn't go into the femoral neck. Will need to talk with ortho about the results.  [RP]    Clinical Course User Index [JK] Randol Simmonds, MD [RP] Yolande Lamar BROCKS, MD   Medical Decision Making Amount and/or Complexity of Data Reviewed Labs: ordered. Radiology: ordered. Decision-making details documented in ED Course. ECG/medicine tests: ordered.  Risk OTC drugs. Prescription drug management.   ***     "

## 2024-08-27 NOTE — Progress Notes (Signed)
 Discussion was had with the ED physician, Dr. Randol regarding this patient 78 year female with right hip pain s/p fall complaining of difficulty bearing weight.  She is neurovascularly intact per report.  X-ray of the right hip and CT scan of the pelvis were reviewed by me along with the radiologist reports.  My independent interpretation demonstrates a non displaced comminuted greater trochanteric femur fracture with extension to the superior femoral neck without clear extension across the femoral neck or into the intertrochanteric femur.  She would benefit from an MRI of the right hip for further evaluation to ensure there is no intertrochanteric or transcervical component.  If there is not, the patient may be treated with protected weight bearing with use of a walker and ASA 81 mg BID x 6 weeks  Rankin Pizza, MD

## 2024-08-27 NOTE — ED Provider Triage Note (Signed)
 Emergency Medicine Provider Triage Evaluation Note  Stacey Lambert , a 78 y.o. female  was evaluated in triage.  Pt complains of hip injury. Pt lost balance and fell last night, landed on R hip.  Denies hitting head of LOC.  Pain to R hip, non radiating.  No back pain or knee pain.   Review of Systems  Positive: As above Negative: As above  Physical Exam  BP (!) 170/81 (BP Location: Right Arm)   Pulse 82   Temp 98 F (36.7 C)   Resp 17   Ht 5' 5 (1.651 m)   Wt 62.1 kg   SpO2 93%   BMI 22.80 kg/m  Gen:   Awake, no distress   Resp:  Normal effort  MSK:   Moves extremities without difficulty  Other:    Medical Decision Making  Medically screening exam initiated at 11:35 AM.  Appropriate orders placed.  SUETTA HOFFMEISTER was informed that the remainder of the evaluation will be completed by another provider, this initial triage assessment does not replace that evaluation, and the importance of remaining in the ED until their evaluation is complete.     Nivia Colon, PA-C 08/27/24 1137

## 2024-08-27 NOTE — ED Provider Notes (Signed)
 "  EMERGENCY DEPARTMENT AT [facility] Provider Note   CSN: [id number] Arrival date & time: 08/27/24  1023     Patient presents with: Stacey Lambert is a 78 y.o. female.    Fall   Patient has a history of hypertension thyroid disease hyperlipidemia.  Patient states last evening she lost her balance and fell on the right side.  She landed on her left hip buttock area.  Did not hit her head.  She denies any loss of consciousness.  Patient states since that time she has been having pain in the right buttock hip area.  Patient states when she is at rest is not painful.  Whenever she tries to stand and bear weight causes pain.  She denies any numbness or weakness.    Prior to Admission medications  Medication Sig Start Date End Date Taking? Authorizing Provider  atorvastatin  (LIPITOR) 10 MG tablet Take 10 mg by mouth daily at 6 PM.    [provider]  clopidogrel  (PLAVIX ) 75 MG tablet Take 75 mg by mouth daily.    [provider]  diazepam  (VALIUM ) 5 MG tablet Take one tablet twice daily as needed for anxiety. 11/30/17   [provider]  levothyroxine  (SYNTHROID , LEVOTHROID) 88 MCG tablet Take 50-88 mcg by mouth daily before breakfast. ON MON WED FRI ON ALL OTHER DAYS    [provider]  lisinopril  (PRINIVIL ,ZESTRIL ) 5 MG tablet Take 5 mg by mouth daily.    [provider]  traMADol  (ULTRAM ) 50 MG tablet Take 1 tablet (50 mg total) by mouth every 6 (six) hours as needed for moderate pain. 02/11/18   Cindy Garnette POUR, MD    Allergies: Patient has no known allergies.    Review of Systems  Updated Vital Signs BP (!) 170/81 (BP Location: Right Arm)   Pulse 82   Temp 98 F (36.7 C)   Resp 17   Ht 1.651 m (5' 5)   Wt 62.1 kg   SpO2 93%   BMI 22.80 kg/m   Physical Exam Vitals and nursing note reviewed.  Constitutional:      General: She is not in acute distress.    Appearance: She is well-developed.   HENT:     Head: Normocephalic and atraumatic.     Right Ear: External ear normal.     Left Ear: External ear normal.  Eyes:     General: No scleral icterus.       Right eye: No discharge.        Left eye: No discharge.     Conjunctiva/sclera: Conjunctivae normal.  Neck:     Trachea: No tracheal deviation.  Cardiovascular:     Rate and Rhythm: Normal rate.  Pulmonary:     Effort: Pulmonary effort is normal. No respiratory distress.     Breath sounds: No stridor.  Abdominal:     General: There is no distension.  Musculoskeletal:        General: Tenderness present. No swelling or deformity.     Cervical back: Neck supple.     Lumbar back: Tenderness present.     Comments: Ttp right hip, pt able to lift right leg off the bed,  Skin:    General: Skin is warm and dry.     Findings: No rash.  Neurological:     Mental Status: She is alert. Mental status is at baseline.     Cranial Nerves: No dysarthria or facial  asymmetry.     Motor: No seizure activity.     Comments: Patient able to plantarflex dorsiflex, normal strength and sensation bilateral lower extremities     (all labs ordered are listed, but only abnormal results are displayed) Labs Reviewed  CBC  BASIC METABOLIC PANEL WITH GFR    EKG: None  Radiology: No results found.   Procedures   Medications Ordered in the ED  acetaminophen  (TYLENOL ) tablet 650 mg (650 mg Oral Given 08/27/24 1035)    Clinical Course as of 08/27/24 1933  Sat Aug 27, 2024  1307 Plain film x-rays do not show acute fracture in spine or pelvis [JK]  1417 CT PELVIS WO CONTRAST CT scan does show right proximal femur fracture [JK]  1523 Case discussed with Dr Teresa,  reviewed films.  DOes not feel pt needs surgery.  Can do protected weight bearing.  But would recommend MRI to make sure it does not extend and is only in greater  [JK]  1558 Assumed care from Dr Randol. 78 yo F who presented with fall and has greater trochanteric hip fracture.  Dw ortho who feels likely non-operative. Can be protective weight bearing. They did say they wanted an MRI to be sure it didn't go into the femoral neck. Will need to talk with ortho about the results.  [RP]    Clinical Course User Index [JK] Randol Simmonds, MD [RP] Yolande Lamar BROCKS, MD                                 Medical Decision Making Problems Addressed: Closed fracture of right hip, initial encounter [facility]): acute illness or injury that poses a threat to life or bodily functions  Amount and/or Complexity of Data Reviewed Labs: ordered. Radiology: ordered. Decision-making details documented in ED Course. ECG/medicine tests: ordered.  Risk OTC drugs. Prescription drug management.   Pt presented with complaints of hip pain after a fall.  Plain films negative.  CT scan ordered which showed hip fracture, greater trochanter.  Not clearly into femoral neck.  Discussed case with Dr Teresa.  Would recommend mri to rule out extension as pt is unable to weight bear.  MRI pending at shift change.  Care turned over to Dr Jakie     Final diagnoses:  Closed fracture of right hip, initial encounter [facility])    ED Discharge Orders     None          Randol Simmonds, MD 08/27/24 1936  "

## 2024-08-27 NOTE — Consult Note (Signed)
 "  ORTHOPAEDIC CONSULTATION  REQUESTING PHYSICIAN: Yolande Lamar BROCKS, MD  PCP:  [facility]  Chief Complaint: Right hip pain  HPI: Stacey Lambert is a 78 y.o. female who complains of right hip pain s/p fall.  She presented to the emergency department due to pain localized to the right hip with inability to bear weight.  She reports the pain is worse with movement and better with rest.  She has no new associated numbness or tingling of the right lower extremity.  She has a past medical history significant for hypertension, hypothyroidism, hyperlipidemia, and lumbar radiculopathy with numbness and tingling at base line in the right lower extremity. She takes plavix  for ***.  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 ***  Past Medical History:  Diagnosis Date   Hyperlipidemia    Hypertension    Thyroid disease    Past Surgical History:  Procedure Laterality Date   ABDOMINAL HYSTERECTOMY     Social History   Socioeconomic History   Marital status: Widowed    Spouse name: Not on file   Number of children: Not on file   Years of education: Not on file   Highest education level: Not on file  Occupational History   Not on file  Tobacco Use   Smoking status: Former    Types: Cigarettes   Smokeless tobacco: Never  Vaping Use   Vaping status: Never Used  Substance and Sexual Activity   Alcohol  use: Never   Drug use: Never   Sexual activity: Not on file  Other Topics Concern   Not on file  Social History Narrative   Not on file   Social Drivers of Health   Tobacco Use: Medium Risk (08/27/2024)   Patient History    Smoking Tobacco Use: Former    Smokeless Tobacco Use: Never    Passive Exposure: Not on Actuary Strain: Not on file  Food Insecurity: Not on file  Transportation Needs: Not on file  Physical Activity: Not on file  Stress: Not on file  Social Connections: Not on file  Depression (EYV7-0): Not on file  Alcohol  Screen: Not on file   Housing: Not on file  Utilities: Not on file  Health Literacy: Not on file   Family History  Problem Relation Age of Onset   Breast cancer Mother        diagnosed late 52's   Allergies[1] Prior to Admission medications  Medication Sig Start Date End Date Taking? Authorizing Provider  atorvastatin  (LIPITOR) 10 MG tablet Take 10 mg by mouth daily at 6 PM.    [provider]  clopidogrel  (PLAVIX ) 75 MG tablet Take 75 mg by mouth daily.    [provider]  diazepam  (VALIUM ) 5 MG tablet Take one tablet twice daily as needed for anxiety. 11/30/17   [provider]  levothyroxine  (SYNTHROID , LEVOTHROID) 88 MCG tablet Take 50-88 mcg by mouth daily before breakfast. ON MON WED FRI ON ALL OTHER DAYS    [provider]  lisinopril  (PRINIVIL ,ZESTRIL ) 5 MG tablet Take 5 mg by mouth daily.    [provider]  traMADol  (ULTRAM ) 50 MG tablet Take 1 tablet (50 mg total) by mouth every 6 (six) hours as needed for moderate pain. 02/11/18   Cindy Garnette POUR, MD   MR HIP RIGHT WO CONTRAST Result Date: 08/27/2024 CLINICAL DATA:  Hip fracture EXAM: MR OF THE RIGHT HIP WITHOUT CONTRAST TECHNIQUE: Multiplanar, multisequence MR imaging was performed.  No intravenous contrast was administered. COMPARISON:  CT 08/27/2024, radiograph 08/27/2024 FINDINGS: Bones: Abnormal marrow edema within the greater trochanter of the femur, corresponding to CT demonstrated fracture. This is contiguous with abnormal irregular linear T1 hypointense, T2 bright signal abnormality involving the intertrochanteric region of right femur with signal abnormality extending to the lesser trochanter of the femur and suspect for nondisplaced intertrochanteric fracture. Signal abnormality extends to the superior aspect of the base of the femoral neck. Abnormal edema extends to the proximal subtrochanteric shaft of the femur. Minimal nonspecific edema within the left pubic symphysis. No abnormal  signal within the pubic rami, sacrum or included left femur. Small anterior acetabular cystic change. Articular cartilage and labrum Articular cartilage:  Noncontributory Labrum: Suboptimally assessed without intra-articular contrast. Grossly normal. Joint or bursal effusion Joint effusion:  No asymmetrical hip effusion Bursae: Noncontributory Muscles and tendons Muscles and tendons: Edema within the right gluteus and abductor muscles. No focal soft tissue hematoma. Other findings Miscellaneous: No evidence for a pelvic effusion. Suspicion of 2.2 cm right adnexal cyst, series 6, image 5. IMPRESSION: 1. Abnormal marrow edema within the greater trochanter of the right femur, corresponding to CT demonstrated fracture. This is contiguous with abnormal signal involving the intertrochanteric region of the right femur with signal abnormality extending to the lesser trochanter of the femur and suspect for nondisplaced intertrochanteric fracture. Signal abnormality extends to the superior aspect of the base of the femoral neck. Abnormal edema/suspected nondisplaced fracture extends to the proximal subtrochanteric shaft of the femur. 2. Edema within the right gluteus and abductor muscles. No focal soft tissue hematoma. 3. Suspicion of 2.2 cm right adnexal cyst. No follow-up imaging recommended. Note: This recommendation does not apply to premenarchal patients and to those with increased risk (genetic, family history, elevated tumor markers or other high-risk factors) of ovarian cancer. Reference: JACR 2020 Feb; 17(2):248-254 Electronically Signed   By: Luke Bun M.D.   On: 08/27/2024 21:09   CT PELVIS WO CONTRAST Result Date: 08/27/2024 CLINICAL DATA:  Provided history: Hip trauma, fracture suspected, xray done EXAM: CT PELVIS WITHOUT CONTRAST TECHNIQUE: Multidetector CT imaging of the pelvis was performed following the standard protocol without intravenous contrast. RADIATION DOSE REDUCTION: This exam was performed  according to the departmental dose-optimization program which includes automated exposure control, adjustment of the mA and/or kV according to patient size and/or use of iterative reconstruction technique. COMPARISON:  Hip radiograph earlier today FINDINGS: Bones/Joint/Cartilage Acute right proximal femur fracture, primarily involving the greater trochanter. Fracture is minimally comminuted. There is no convincing intertrochanteric component. Fracture extends to the base of the femoral neck. No hip dislocation. Moderate osteoarthritis of both hips, pubic symphysis and sacroiliac joints. Diffuse degenerative change in the included lumbar spine. Trace right hip joint effusion. Ligaments Suboptimally assessed by CT. Muscles and Tendons No intramuscular hematoma. Soft tissues No confluent soft tissue hematoma. Faint edema about the lateral right hip subcutaneous tissues. Aorto bi-iliac atherosclerosis. Colonic diverticulosis. Right renal cyst. No further follow-up imaging is recommended. IMPRESSION: 1. Acute right proximal femur fracture, primarily involving the greater trochanter. Fracture is minimally comminuted. There is no convincing intertrochanteric component. Fracture extends to the base of the femoral neck. 2. Moderate osteoarthritis of both hips, pubic symphysis and sacroiliac joints. Aortic Atherosclerosis (ICD10-I70.0). Electronically Signed   By: Andrea Gasman M.D.   On: 08/27/2024 14:11   DG Lumbar Spine Complete Result Date: 08/27/2024 CLINICAL DATA:  Fall and back pain. EXAM: LUMBAR SPINE - COMPLETE 4+ VIEW COMPARISON:  None Available. FINDINGS:  Five lumbar type vertebra. There is no acute fracture or subluxation of the lumbar spine. The bones are osteopenic. Multilevel degenerative changes with disc space narrowing and endplate irregularity and spurring. Multilevel facet arthropathy. The soft tissues are unremarkable. IMPRESSION: 1. No acute fracture or subluxation of the lumbar spine. 2.  Multilevel degenerative changes. Electronically Signed   By: Vanetta Chou M.D.   On: 08/27/2024 12:33   DG Hip Unilat  With Pelvis 2-3 Views Right Result Date: 08/27/2024 CLINICAL DATA:  Fall. EXAM: DG HIP (WITH OR WITHOUT PELVIS) 2-3V RIGHT COMPARISON:  None Available. FINDINGS: No acute fracture or dislocation. The bones are osteopenic. Lower lumbar degenerative changes. The soft tissues are unremarkable. IMPRESSION: No acute fracture or dislocation. Electronically Signed   By: Vanetta Chou M.D.   On: 08/27/2024 12:21    Positive ROS: All other systems have been reviewed and were otherwise negative with the exception of those mentioned in the HPI and as above.  Physical Exam: General: Alert, no acute distress Cardiovascular: No pedal edema Respiratory: No cyanosis, no use of accessory musculature GI: No organomegaly, abdomen is soft and non-tender Skin: No lesions in the area of chief complaint Neurologic: Sensation intact distally Psychiatric: Patient is competent for consent with normal mood and affect Lymphatic: No axillary or cervical lymphadenopathy  MUSCULOSKELETAL: Right lower extremity - skin intact - TTP right hip - pain with log roll - equal leg lengths - motor intact ankle dorsiflexion/plantarflexion - sensation intact sural, saphenous, tibial, deep and superficial peroneal nerve distribution - 2+ DP pulse  Imaging:  X-rays: 3 views of  right hip and pelvis were obtained and reviewed.  My independent interpretation is as follows: evidence of mild comminution of the greater trochanter without clear extension into the inter trochanteric region  CT pelvis was obtained today and reviewed by me. My independent interpretation is as follows: evidence of a greater trochanteric femur fracture with extension to the superior femoral neck with suspicion of extension into the intertrochanteric region  MRI right hip obtained today was reviewed by me.  My independent  interpretation is as follows:  Evidence of a nondisplaced intertrochanteric femur fracture with greater trochanteric fracture as noted previously extending into the anterior lesser trochanteric reigion and extending out of the medial cortex.  Assessment: 79 year old female with non displaced right intertrochanteric femur fracture as evidenced on MRI of the right hip.  The patient is indicated to undergo right femur cephalomedullary nail insertion due to the extension of the fracture through the lesser trochanter to allow for early mobilization and stabilization of the fracture.  Risks, benefits and alternatives were reviewed with the patient.  Risks which were discussed include, but are not limited to bleeding, infection, damage to surrounding structures such as nerves, arteries and veins, need for additional procedures in the case of hardware migration, failure or peri prosthetic fracture, blood clots, malunion and nonunion, persistent disability and risks associated with anesthesia.  The patient expressed understanding and elected to proceed with right femur cephalomedullary nail insertion.  She will begin PT post op for mobilization and will require 6 weeks of ASA BID  for DVT ppx.  Plan for ancef  2 g for abx ppx and 1 g TXA to minimize hematoma formation.  Plan: NWB RLE Case request placed NPO at midnight Proceed with right femoral cephalomedullary nail insertion tomorrow Pre-op labs Pre-op clearance per medicine Ancef  2 g for abx ppx 1 g TXA for minimizing blood loss PT tomorrow post op for mobilization  Dispo: pending OR  Rankin LELON Pizza, MD    08/27/2024 10:16 PM     [1] No Known Allergies  "

## 2024-08-27 NOTE — ED Notes (Signed)
 Patient transported to MRI

## 2024-08-27 NOTE — ED Notes (Signed)
 ED Provider at bedside.

## 2024-08-27 NOTE — ED Triage Notes (Signed)
 Pt had a mechanical fall last night around 8 pm when turning around to turn the light off in the bathroom, pt landed on her right hip, c.o pain to that area. Pt unable to bare weight on that leg due to the pain

## 2024-08-27 NOTE — Progress Notes (Signed)
 MRI was reviewed along with the radiologist report.  On my independent interpretation there is extension of the greater trochanteric fracture into the anterior lesser trochanteric region and exiting out the medial cortex.  As such the patient would benefit from internal fixation of the right hip with a cephalomedullary nail. She will be admitted to the internal medicine service and planned for OR in the morning. Full consult note to follow.  Rankin Pizza, MD

## 2024-08-28 ENCOUNTER — Inpatient Hospital Stay (HOSPITAL_COMMUNITY): Admitting: Anesthesiology

## 2024-08-28 ENCOUNTER — Encounter (HOSPITAL_COMMUNITY): Payer: Self-pay | Admitting: Internal Medicine

## 2024-08-28 ENCOUNTER — Encounter (HOSPITAL_COMMUNITY)
Admission: EM | Disposition: A | Discharge status: Home Health | Payer: Self-pay | Source: Home / Self Care | Attending: Internal Medicine

## 2024-08-28 ENCOUNTER — Other Ambulatory Visit: Payer: Self-pay

## 2024-08-28 ENCOUNTER — Inpatient Hospital Stay (HOSPITAL_COMMUNITY)

## 2024-08-28 DIAGNOSIS — S72141A Displaced intertrochanteric fracture of right femur, initial encounter for closed fracture: Secondary | ICD-10-CM

## 2024-08-28 DIAGNOSIS — E039 Hypothyroidism, unspecified: Secondary | ICD-10-CM | POA: Diagnosis present

## 2024-08-28 DIAGNOSIS — S72144A Nondisplaced intertrochanteric fracture of right femur, initial encounter for closed fracture: Secondary | ICD-10-CM | POA: Diagnosis present

## 2024-08-28 DIAGNOSIS — W010XXA Fall on same level from slipping, tripping and stumbling without subsequent striking against object, initial encounter: Secondary | ICD-10-CM | POA: Diagnosis present

## 2024-08-28 DIAGNOSIS — S72009A Fracture of unspecified part of neck of unspecified femur, initial encounter for closed fracture: Secondary | ICD-10-CM | POA: Diagnosis present

## 2024-08-28 DIAGNOSIS — H538 Other visual disturbances: Secondary | ICD-10-CM | POA: Diagnosis not present

## 2024-08-28 DIAGNOSIS — I10 Essential (primary) hypertension: Secondary | ICD-10-CM | POA: Diagnosis present

## 2024-08-28 DIAGNOSIS — Z87891 Personal history of nicotine dependence: Secondary | ICD-10-CM | POA: Diagnosis not present

## 2024-08-28 DIAGNOSIS — Z90710 Acquired absence of both cervix and uterus: Secondary | ICD-10-CM | POA: Diagnosis not present

## 2024-08-28 DIAGNOSIS — Z7982 Long term (current) use of aspirin: Secondary | ICD-10-CM | POA: Diagnosis not present

## 2024-08-28 DIAGNOSIS — Z79890 Hormone replacement therapy (postmenopausal): Secondary | ICD-10-CM | POA: Diagnosis not present

## 2024-08-28 DIAGNOSIS — E785 Hyperlipidemia, unspecified: Secondary | ICD-10-CM | POA: Diagnosis present

## 2024-08-28 DIAGNOSIS — Z7902 Long term (current) use of antithrombotics/antiplatelets: Secondary | ICD-10-CM | POA: Diagnosis not present

## 2024-08-28 DIAGNOSIS — I2510 Atherosclerotic heart disease of native coronary artery without angina pectoris: Secondary | ICD-10-CM | POA: Diagnosis present

## 2024-08-28 DIAGNOSIS — G629 Polyneuropathy, unspecified: Secondary | ICD-10-CM | POA: Diagnosis present

## 2024-08-28 DIAGNOSIS — Z79899 Other long term (current) drug therapy: Secondary | ICD-10-CM | POA: Diagnosis not present

## 2024-08-28 DIAGNOSIS — I252 Old myocardial infarction: Secondary | ICD-10-CM | POA: Diagnosis not present

## 2024-08-28 DIAGNOSIS — M5416 Radiculopathy, lumbar region: Secondary | ICD-10-CM | POA: Diagnosis present

## 2024-08-28 HISTORY — PX: INTRAMEDULLARY (IM) NAIL INTERTROCHANTERIC: SHX5875

## 2024-08-28 LAB — PROTIME-INR
INR: 1 (ref 0.8–1.2)
Prothrombin Time: 14.1 s (ref 11.4–15.2)

## 2024-08-28 LAB — TYPE AND SCREEN
ABO/RH(D): O POS
Antibody Screen: NEGATIVE

## 2024-08-28 LAB — CBC
HCT: 36.5 % (ref 36.0–46.0)
Hemoglobin: 12.3 g/dL (ref 12.0–15.0)
MCH: 26.8 pg (ref 26.0–34.0)
MCHC: 33.7 g/dL (ref 30.0–36.0)
MCV: 79.5 fL — ABNORMAL LOW (ref 80.0–100.0)
Platelets: 313 K/uL (ref 150–400)
RBC: 4.59 MIL/uL (ref 3.87–5.11)
RDW: 16.6 % — ABNORMAL HIGH (ref 11.5–15.5)
WBC: 11 K/uL — ABNORMAL HIGH (ref 4.0–10.5)
nRBC: 0 % (ref 0.0–0.2)

## 2024-08-28 LAB — CREATININE, SERUM
Creatinine, Ser: 0.48 mg/dL (ref 0.44–1.00)
GFR, Estimated: >60 mL/min

## 2024-08-28 LAB — APTT: aPTT: 32 s (ref 24–36)

## 2024-08-28 SURGERY — FIXATION, FRACTURE, INTERTROCHANTERIC, WITH INTRAMEDULLARY ROD
Anesthesia: General | Laterality: Right

## 2024-08-28 MED ORDER — PROPOFOL 10 MG/ML IV BOLUS
INTRAVENOUS | Status: AC
  Filled 2024-08-28: qty 20

## 2024-08-28 MED ORDER — AMLODIPINE BESYLATE 10 MG PO TABS
10.0000 mg | ORAL_TABLET | Freq: Every day | ORAL | Status: DC
  Administered 2024-08-28 – 2024-08-30 (×3): 10 mg via ORAL
  Filled 2024-08-28 (×3): qty 1

## 2024-08-28 MED ORDER — ACETAMINOPHEN 500 MG PO TABS: 1000.0000 mg | ORAL_TABLET | Freq: Once | ORAL | Status: DC

## 2024-08-28 MED ORDER — SUGAMMADEX SODIUM 200 MG/2ML IV SOLN
INTRAVENOUS | Status: DC | PRN
  Administered 2024-08-28: 200 mg via INTRAVENOUS

## 2024-08-28 MED ORDER — TRANEXAMIC ACID-NACL 1000-0.7 MG/100ML-% IV SOLN
INTRAVENOUS | Status: AC
  Filled 2024-08-28: qty 100

## 2024-08-28 MED ORDER — PROPOFOL 10 MG/ML IV BOLUS
INTRAVENOUS | Status: DC | PRN
  Administered 2024-08-28: 100 mg via INTRAVENOUS

## 2024-08-28 MED ORDER — OXYCODONE HCL 5 MG PO TABS: 5.0000 mg | ORAL_TABLET | Freq: Once | ORAL | Status: DC | PRN

## 2024-08-28 MED ORDER — ONDANSETRON HCL 4 MG/2ML IJ SOLN: 4.0000 mg | Freq: Four times a day (QID) | INTRAMUSCULAR | Status: DC | PRN

## 2024-08-28 MED ORDER — ACETAMINOPHEN 650 MG RE SUPP: 650.0000 mg | Freq: Four times a day (QID) | RECTAL | Status: DC | PRN

## 2024-08-28 MED ORDER — POLYVINYL ALCOHOL 1.4 % OP SOLN
1.0000 [drp] | OPHTHALMIC | Status: DC | PRN
  Filled 2024-08-28: qty 15

## 2024-08-28 MED ORDER — LORAZEPAM 1 MG PO TABS
1.0000 mg | ORAL_TABLET | Freq: Every day | ORAL | Status: DC | PRN
  Administered 2024-08-28: 1 mg via ORAL
  Filled 2024-08-28: qty 1

## 2024-08-28 MED ORDER — CEFAZOLIN SODIUM-DEXTROSE 2-4 GM/100ML-% IV SOLN
INTRAVENOUS | Status: AC
  Filled 2024-08-28: qty 100

## 2024-08-28 MED ORDER — ACETAMINOPHEN 500 MG PO TABS
ORAL_TABLET | ORAL | Status: AC
  Filled 2024-08-28: qty 2

## 2024-08-28 MED ORDER — PHENYLEPHRINE 80 MCG/ML (10ML) SYRINGE FOR IV PUSH (FOR BLOOD PRESSURE SUPPORT)
PREFILLED_SYRINGE | INTRAVENOUS | Status: DC | PRN
  Administered 2024-08-28: 80 ug via INTRAVENOUS

## 2024-08-28 MED ORDER — LACTATED RINGERS IV SOLN: INTRAVENOUS | Status: DC

## 2024-08-28 MED ORDER — FENTANYL CITRATE (PF) 100 MCG/2ML IJ SOLN
INTRAMUSCULAR | Status: AC
  Filled 2024-08-28: qty 2

## 2024-08-28 MED ORDER — ONDANSETRON HCL 4 MG PO TABS: 4.0000 mg | ORAL_TABLET | Freq: Four times a day (QID) | ORAL | Status: DC | PRN

## 2024-08-28 MED ORDER — ONDANSETRON HCL 4 MG PO TABS
4.0000 mg | ORAL_TABLET | Freq: Four times a day (QID) | ORAL | Status: DC | PRN
  Administered 2024-08-28: 4 mg via ORAL
  Filled 2024-08-28: qty 1

## 2024-08-28 MED ORDER — FLUOXETINE HCL 20 MG PO TABS: 20.0000 mg | ORAL_TABLET | Freq: Every day | ORAL | Status: DC | PRN

## 2024-08-28 MED ORDER — ACETAMINOPHEN 10 MG/ML IV SOLN: 1000.0000 mg | Freq: Once | INTRAVENOUS | Status: DC | PRN

## 2024-08-28 MED ORDER — LEVOTHYROXINE SODIUM 88 MCG PO TABS
88.0000 ug | ORAL_TABLET | Freq: Every day | ORAL | Status: DC
  Administered 2024-08-29 – 2024-08-30 (×2): 88 ug via ORAL
  Filled 2024-08-28 (×2): qty 1

## 2024-08-28 MED ORDER — METOCLOPRAMIDE HCL 5 MG/ML IJ SOLN: 5.0000 mg | Freq: Three times a day (TID) | INTRAMUSCULAR | Status: DC | PRN

## 2024-08-28 MED ORDER — CHLORHEXIDINE GLUCONATE 0.12 % MT SOLN
15.0000 mL | Freq: Once | OROMUCOSAL | Status: AC
  Administered 2024-08-28: 15 mL via OROMUCOSAL

## 2024-08-28 MED ORDER — OXYCODONE HCL 5 MG/5ML PO SOLN: 5.0000 mg | Freq: Once | ORAL | Status: DC | PRN

## 2024-08-28 MED ORDER — DROPERIDOL 2.5 MG/ML IJ SOLN: 0.6250 mg | Freq: Once | INTRAMUSCULAR | Status: DC | PRN

## 2024-08-28 MED ORDER — SENNOSIDES-DOCUSATE SODIUM 8.6-50 MG PO TABS: 1.0000 | ORAL_TABLET | Freq: Every day | ORAL | 0 refills | Status: AC

## 2024-08-28 MED ORDER — OXYCODONE HCL 5 MG PO TABS: 5.0000 mg | ORAL_TABLET | Freq: Four times a day (QID) | ORAL | 0 refills | Status: DC | PRN

## 2024-08-28 MED ORDER — ENOXAPARIN SODIUM 40 MG/0.4ML IJ SOSY
40.0000 mg | PREFILLED_SYRINGE | INTRAMUSCULAR | Status: DC
  Administered 2024-08-29 – 2024-08-30 (×2): 40 mg via SUBCUTANEOUS
  Filled 2024-08-28 (×2): qty 0.4

## 2024-08-28 MED ORDER — ACETAMINOPHEN 500 MG PO TABS: 1000.0000 mg | ORAL_TABLET | Freq: Three times a day (TID) | ORAL | 0 refills | Status: AC

## 2024-08-28 MED ORDER — 0.9 % SODIUM CHLORIDE (POUR BTL) OPTIME
TOPICAL | Status: DC | PRN
  Administered 2024-08-28: 1000 mL

## 2024-08-28 MED ORDER — ASPIRIN 81 MG PO TBEC: 81.0000 mg | DELAYED_RELEASE_TABLET | Freq: Two times a day (BID) | ORAL | 0 refills | Status: AC

## 2024-08-28 MED ORDER — ONDANSETRON HCL 4 MG/2ML IJ SOLN: 4.0000 mg | Freq: Once | INTRAMUSCULAR | Status: DC

## 2024-08-28 MED ORDER — CHLORHEXIDINE GLUCONATE 4 % EX SOLN: 60.0000 mL | Freq: Once | CUTANEOUS | Status: DC

## 2024-08-28 MED ORDER — ONDANSETRON HCL 4 MG/2ML IJ SOLN
INTRAMUSCULAR | Status: DC | PRN
  Administered 2024-08-28: 4 mg via INTRAVENOUS

## 2024-08-28 MED ORDER — TRANEXAMIC ACID-NACL 1000-0.7 MG/100ML-% IV SOLN
1000.0000 mg | INTRAVENOUS | Status: AC
  Administered 2024-08-28: 1000 mg via INTRAVENOUS

## 2024-08-28 MED ORDER — EPHEDRINE SULFATE-NACL 50-0.9 MG/10ML-% IV SOSY
PREFILLED_SYRINGE | INTRAVENOUS | Status: DC | PRN
  Administered 2024-08-28: 5 mg via INTRAVENOUS

## 2024-08-28 MED ORDER — DEXAMETHASONE SOD PHOSPHATE PF 10 MG/ML IJ SOLN
INTRAMUSCULAR | Status: DC | PRN
  Administered 2024-08-28: 10 mg via INTRAVENOUS

## 2024-08-28 MED ORDER — DOCUSATE SODIUM 100 MG PO CAPS
100.0000 mg | ORAL_CAPSULE | Freq: Two times a day (BID) | ORAL | Status: DC
  Administered 2024-08-28 – 2024-08-30 (×5): 100 mg via ORAL
  Filled 2024-08-28 (×5): qty 1

## 2024-08-28 MED ORDER — POVIDONE-IODINE 10 % EX SWAB: 2.0000 | Freq: Once | CUTANEOUS | Status: DC

## 2024-08-28 MED ORDER — FENTANYL CITRATE (PF) 250 MCG/5ML IJ SOLN
INTRAMUSCULAR | Status: DC | PRN
  Administered 2024-08-28 (×2): 50 ug via INTRAVENOUS

## 2024-08-28 MED ORDER — LACTATED RINGERS IV SOLN: INTRAVENOUS | Status: DC | PRN

## 2024-08-28 MED ORDER — ACETAMINOPHEN 325 MG PO TABS: 650.0000 mg | ORAL_TABLET | Freq: Four times a day (QID) | ORAL | Status: DC | PRN

## 2024-08-28 MED ORDER — ROSUVASTATIN CALCIUM 5 MG PO TABS
10.0000 mg | ORAL_TABLET | Freq: Every day | ORAL | Status: DC
  Administered 2024-08-28 – 2024-08-29 (×2): 10 mg via ORAL
  Filled 2024-08-28 (×2): qty 2

## 2024-08-28 MED ORDER — CHLORHEXIDINE GLUCONATE 0.12 % MT SOLN
OROMUCOSAL | Status: AC
  Filled 2024-08-28: qty 15

## 2024-08-28 MED ORDER — LIDOCAINE 2% (20 MG/ML) 5 ML SYRINGE
INTRAMUSCULAR | Status: DC | PRN
  Administered 2024-08-28: 60 mg via INTRAVENOUS

## 2024-08-28 MED ORDER — CEFAZOLIN SODIUM-DEXTROSE 2-4 GM/100ML-% IV SOLN
2.0000 g | Freq: Four times a day (QID) | INTRAVENOUS | Status: AC
  Administered 2024-08-28 (×2): 2 g via INTRAVENOUS
  Filled 2024-08-28 (×2): qty 100

## 2024-08-28 MED ORDER — CEFAZOLIN SODIUM-DEXTROSE 2-4 GM/100ML-% IV SOLN
2.0000 g | INTRAVENOUS | Status: AC
  Administered 2024-08-28: 2 g via INTRAVENOUS

## 2024-08-28 MED ORDER — FENTANYL CITRATE (PF) 50 MCG/ML IJ SOSY: 12.5000 ug | PREFILLED_SYRINGE | INTRAMUSCULAR | Status: DC | PRN

## 2024-08-28 MED ORDER — ROCURONIUM BROMIDE 10 MG/ML (PF) SYRINGE
PREFILLED_SYRINGE | INTRAVENOUS | Status: DC | PRN
  Administered 2024-08-28: 50 mg via INTRAVENOUS

## 2024-08-28 MED ORDER — FENTANYL CITRATE (PF) 100 MCG/2ML IJ SOLN
25.0000 ug | INTRAMUSCULAR | Status: DC | PRN
  Administered 2024-08-28 (×2): 50 ug via INTRAVENOUS

## 2024-08-28 MED ORDER — METOCLOPRAMIDE HCL 5 MG PO TABS: 5.0000 mg | ORAL_TABLET | Freq: Three times a day (TID) | ORAL | Status: DC | PRN

## 2024-08-28 MED ORDER — TRANEXAMIC ACID-NACL 1000-0.7 MG/100ML-% IV SOLN
1000.0000 mg | Freq: Once | INTRAVENOUS | Status: AC
  Administered 2024-08-28: 1000 mg via INTRAVENOUS
  Filled 2024-08-28: qty 100

## 2024-08-28 MED ORDER — ORAL CARE MOUTH RINSE: 15.0000 mL | Freq: Once | OROMUCOSAL | Status: AC

## 2024-08-28 SURGICAL SUPPLY — 30 items
BAG COUNTER SPONGE SURGICOUNT (BAG) IMPLANT
BIT DRILL 4.0X280 (BIT) IMPLANT
CHLORAPREP W/TINT 26 (MISCELLANEOUS) ×1 IMPLANT
COVER PERINEAL POST (MISCELLANEOUS) ×1 IMPLANT
COVER SURGICAL LIGHT HANDLE (MISCELLANEOUS) ×1 IMPLANT
DRAPE C-ARMOR (DRAPES) ×1 IMPLANT
DRAPE STERI IOBAN 125X83 (DRAPES) ×1 IMPLANT
DRAPE U-SHAPE 47X51 STRL (DRAPES) ×2 IMPLANT
DRESSING MEPILEX FLEX 4X4 (GAUZE/BANDAGES/DRESSINGS) ×2 IMPLANT
DRSG MEPILEX POST OP 4X8 (GAUZE/BANDAGES/DRESSINGS) IMPLANT
ELECT REM PT RETURN 15FT ADLT (MISCELLANEOUS) ×1 IMPLANT
GAUZE XEROFORM 5X9 LF (GAUZE/BANDAGES/DRESSINGS) ×1 IMPLANT
GLOVE BIO SURGEON STRL SZ 6 (GLOVE) ×1 IMPLANT
GLOVE BIO SURGEON STRL SZ7.5 (GLOVE) ×1 IMPLANT
GLOVE BIOGEL PI IND STRL 6.5 (GLOVE) ×1 IMPLANT
GLOVE BIOGEL PI IND STRL 8 (GLOVE) ×1 IMPLANT
GOWN STRL REUS W/ TWL LRG LVL3 (GOWN DISPOSABLE) ×2 IMPLANT
KIT BASIN OR (CUSTOM PROCEDURE TRAY) ×1 IMPLANT
KIT TURNOVER KIT B (KITS) IMPLANT
NAIL TROCH SHORT 11X20 130D (Nail) IMPLANT
PACK GENERAL/GYN (CUSTOM PROCEDURE TRAY) ×1 IMPLANT
PIN GUIDE THRD AR 3.2X330 (PIN) IMPLANT
SCREW CORT CAPT FT 5.0X36 (Screw) IMPLANT
SCREW TELESCOPING LAG 10.5X90 (Screw) IMPLANT
SOLN 0.9% NACL POUR BTL 1000ML (IV SOLUTION) ×1 IMPLANT
STAPLER SKIN PROX 35W (STAPLE) ×1 IMPLANT
SUT VIC AB 0 CT1 36 (SUTURE) ×1 IMPLANT
SUT VIC AB 1 CT1 36 (SUTURE) ×1 IMPLANT
SUT VIC AB 2-0 CT1 TAPERPNT 27 (SUTURE) ×1 IMPLANT
TOOL ACTIVATION TELES LAG SCRW (INSTRUMENTS) IMPLANT

## 2024-08-28 NOTE — ED Provider Notes (Signed)
 I have discussed the case with Dr. Dena of [facility], who agrees to admit the patient.   Raford Lenis, MD 08/28/24 [phone]

## 2024-08-28 NOTE — Transfer of Care (Signed)
 Immediate Anesthesia Transfer of Care Note  Patient: Stacey Lambert  Procedure(s) Performed: FIXATION, FRACTURE, INTERTROCHANTERIC, WITH INTRAMEDULLARY ROD (Right)  Patient Location: PACU  Anesthesia Type:General  Level of Consciousness: awake, alert , oriented, and patient cooperative  Airway & Oxygen Therapy: Patient Spontanous Breathing and Patient connected to face mask oxygen  Post-op Assessment: Report given to RN, Post -op Vital signs reviewed and stable, Patient moving all extremities, and Patient moving all extremities X 4  Post vital signs: Reviewed and stable  Last Vitals:  Vitals Value Taken Time  BP 155/70 08/28/24 11:48  Temp    Pulse 66 08/28/24 11:53  Resp 21 08/28/24 11:53  SpO2 91 % 08/28/24 11:53  Vitals shown include unfiled device data.  Last Pain:  Vitals:   08/28/24 0956  TempSrc:   PainSc: 0-No pain         Complications: No notable events documented.

## 2024-08-28 NOTE — Progress Notes (Signed)
 PT Cancellation Note  Patient Details Name: Stacey Lambert MRN: [id number] DOB: [birth year]   Cancelled Treatment:    Reason Eval/Treat Not Completed: (P) Other (comment)  Pt scheduled for Right femoral cephalomedullary nail insertion today. PT will follow back for Evaluation after procedure.   Kaceton Vieau B. Fleeta Lapidus PT, DPT Acute Rehabilitation Services Please use secure chat or  Call Office [phone]    Almarie KATHEE Fleeta [facility] 08/28/2024, 8:05 AM

## 2024-08-28 NOTE — Progress Notes (Signed)
 Chaplain asked to visit by pt while in ED. She shares distress that she's been in ED hallway bed for 18 hours and needs someone to talk with. Chaplain offers empathetic support and encouragement until a trauma calls chaplain away.

## 2024-08-28 NOTE — Anesthesia Postprocedure Evaluation (Signed)
"   Anesthesia Post Note  Patient: RHIA BLATCHFORD  Procedure(s) Performed: FIXATION, FRACTURE, INTERTROCHANTERIC, WITH INTRAMEDULLARY ROD (Right)     Patient location during evaluation: PACU Anesthesia Type: General Level of consciousness: awake and alert Pain management: pain level controlled Vital Signs Assessment: post-procedure vital signs reviewed and stable Respiratory status: spontaneous breathing, nonlabored ventilation, respiratory function stable and patient connected to nasal cannula oxygen Cardiovascular status: blood pressure returned to baseline and stable Postop Assessment: no apparent nausea or vomiting Anesthetic complications: no   No notable events documented.  Last Vitals:  Vitals:   08/28/24 1330 08/28/24 1411  BP: (!) 169/73 (!) 162/83  Pulse: 66 66  Resp: 19   Temp:  36.8 C  SpO2: 95% 97%    Last Pain:  Vitals:   08/28/24 1411  TempSrc: Oral  PainSc:                  Thom JONELLE Peoples      "

## 2024-08-28 NOTE — Progress Notes (Signed)
. ° ° °  PROCEDURAL EXPEDITER PROGRESS NOTE  Patient Name: Stacey Lambert  DOB:[birth year] Date of Admission: 08/27/2024  Date of Assessment:08/28/2024   -------------------------------------------------------------------------------------------------------------------   Brief clinical summary: Pt to OR today for Right femoral cephalomedullary nail insertion   Orders in place:  Yes   Communication with surgical team if no orders: N/A  Labs, test, and orders reviewed: Y - placed surgical PCR orders  Requires surgical clearance:  No  Barriers noted: N/A   Intervention provided by [facility] team: N/A  Barrier resolved:  not applicable   -------------------------------------------------------------------------------------------------------------------  [employer], [place], [place] Please contact us  directly via secure chat (search for [facility]) or by calling us  at [phone] [facility]).

## 2024-08-28 NOTE — Op Note (Signed)
 "   Date of Surgery: 08/28/2024  INDICATIONS: [name] is a 78 y.o.-year-old female who sustained a right nondisplaced intertrochanteric femur fracture. The risks and benefits of the procedure discussed with the patient prior to the procedure and all questions were answered; consent was obtained.   PREOPERATIVE DIAGNOSIS: right intertrochanteric femur fracture   POSTOPERATIVE DIAGNOSIS: Same   PROCEDURE: Treatment of intertrochanteric with intramedullary implant. CPT 72754   SURGEON: Rankin Pizza, M.D.   Assistants: None  ANESTHESIA: general   IV FLUIDS AND URINE: See anesthesia record   ESTIMATED BLOOD LOSS: 15 cc  IMPLANTS:  Arthrex 11 x 200 mm cephalomedullary nail, 90 mm cephalomedullary screw, 36 interlocking screw  DRAINS: None.     COMPLICATIONS: None.   DESCRIPTION OF PROCEDURE: The patient was brought to the operating room and placed supine on the operating table. The patient's leg had been signed prior to the procedure. The patient had the anesthesia placed by the anesthesiologist. The prep verification and incision time-outs were performed to confirm that this was the correct patient, site, side and location. The patient had an SCD on the opposite lower extremity. The patient did receive antibiotics prior to the incision and was re-dosed during the procedure as needed at indicated intervals. The patient was positioned on the fracture table with the table in traction and internal rotation to reduce the hip. The well leg was placed in a scissor position and all bony prominences were well-padded. The patient had the lower extremity prepped and draped in the standard surgical fashion. The incision was made 4 finger breadths superior to the greater trochanter. A guide pin was inserted into the tip of the greater trochanter under fluoroscopic guidance. An opening reamer was used to gain access to the femoral canal. The nail inserted down the femoral canal to its proper depth. The  appropriate version of insertion for the lag screw was found under fluoroscopy. A pin was inserted up the femoral neck through the jig with center center position within the femoral head and neck. The length of the lag screw was then measured to be a 90 mm cscrew The lag screw was inserted as near to center-center in the head as possible. The leg was taken out of traction, then the compression screw was used to compress across the fracture. Compression was visualized on serial xrays.   We next turned our attention to the distal interlocking screw.  This was placed through the drill guide of the nail inserter.  A small incision was made overlying the lateral thigh at the screw site, and a tonsil was used to disect down to bone.  A drill pass was made through the jig and across the nail through both cortices.  This was measured, and the appropriate screw was placed under hand power and found to have good bite.    The wound was copiously irrigated with saline, the deep tissue was closed with 0 vicryl, and the subcutaneous layer closed with 2-0 vicryl and the skin was reapproximated with staples. The wounds were cleaned and dried a final time and a sterile dressing was placed. The hip was taken through a range of motion at the end of the case under fluoroscopic imaging to visualize the approach-withdraw phenomenon and confirm implant length in the head. The patient was then awakened from anesthesia and taken to the recovery room in stable condition. All counts were correct at the end of the case.   POSTOPERATIVE PLAN: The patient will be weight bearing as  tolerated and will return in 2 weeks for staple removal and the patient will receive DVT prophylaxis based on other medications, activity level, and risk ratio of bleeding to thrombosis.     Rankin Pizza, MD [facility] 11:47 AM  "

## 2024-08-28 NOTE — Progress Notes (Signed)
 " PROGRESS NOTE    Stacey Lambert  FMW:[id number] DOB: [birth year] DOA: 08/27/2024 PCP: [facility]  Outpatient Specialists:     Brief Narrative:  Patient is an 78 year old female with past medical history significant for hypertension, CVA, thyroid disease and hyperlipidemia.  Patient was admitted earlier today with acute right proximal femur fracture, primarily involving the greater trochanter, minimally comminuted, following a mechanical fall.  Patient underwent fixation of the intertrochanteric fracture with intramedullary rod.  08/28/2024: Patient seen postop.  Pain is reasonably controlled.  Continue to optimize pain control.  Monitor blood pressure closely.   Assessment & Plan:   Principal Problem:   Hip fracture (HCC)   Mechanical fall complicated by right right femur fracture: - Patient fell on right side - Persistent pain found to have femur fracture on MRI, CT and x-ray - Orthopedics consulted, patient has undergone surgery (see above documentation). - Postop management as per orthopedic team. - Continue to optimize pain control.    Hypertension: - Currently uncontrolled. - Well-controlled pain. - Gradually optimize blood pressure.  Hypothyroidism: -continue Synthroid    Hyperlipidemia: -continue Crestor      DVT prophylaxis: Subcutaneous Lovenox  Code Status: Full code Family Communication:  Disposition Plan: This will depend on hospital course.   Consultants:  Orthopedic surgery.  Procedures:  Intramedullary fixation of right femur fracture.  Antimicrobials:  Perioperative cefazolin .   Subjective: Pain is reasonably controlled.  Objective: Vitals:   08/27/24 2059 08/28/24 0308 08/28/24 0725 08/28/24 0726  BP: (!) 168/79 (!) 146/62 (!) 161/70   Pulse: 70 (!) 55  (!) 58  Resp: 20 18  18   Temp: 98.2 F (36.8 C)     TempSrc: Oral     SpO2: 99% 99%  99%  Weight:      Height:       No intake or output data in the 24 hours ending 08/28/24  0753 Filed Weights   08/27/24 1032  Weight: 62.1 kg    Examination:  General exam: Appears calm and comfortable  Respiratory system: Clear to auscultation. Respiratory effort normal. Cardiovascular system: S1 & S2 heard Gastrointestinal system: Abdomen is soft and nontender.  Central nervous system: Awake and alert.    Data Reviewed: I have personally reviewed following labs and imaging studies  CBC: Recent Labs  Lab 08/27/24 1235  WBC 8.4  HGB 11.7*  HCT 35.3*  MCV 81.0  PLT 321   Basic Metabolic Panel: Recent Labs  Lab 08/27/24 1235  NA 140  K 4.0  CL 107  CO2 22  GLUCOSE 103*  BUN 10  CREATININE 0.47  CALCIUM  9.2   GFR: Estimated Creatinine Clearance: 50.5 mL/min (by C-G formula based on SCr of 0.47 mg/dL). Liver Function Tests: No results for input(s): AST, ALT, ALKPHOS, BILITOT, PROT, ALBUMIN in the last 168 hours. No results for input(s): LIPASE, AMYLASE in the last 168 hours. No results for input(s): AMMONIA in the last 168 hours. Coagulation Profile: Recent Labs  Lab 08/28/24 0504  INR 1.0   Cardiac Enzymes: No results for input(s): CKTOTAL, CKMB, CKMBINDEX, TROPONINI in the last 168 hours. BNP (last 3 results) No results for input(s): PROBNP in the last 8760 hours. HbA1C: No results for input(s): HGBA1C in the last 72 hours. CBG: No results for input(s): GLUCAP in the last 168 hours. Lipid Profile: No results for input(s): CHOL, HDL, LDLCALC, TRIG, CHOLHDL, LDLDIRECT in the last 72 hours. Thyroid Function Tests: No results for input(s): TSH, T4TOTAL, FREET4, T3FREE, THYROIDAB in the last 72  hours. Anemia Panel: No results for input(s): VITAMINB12, FOLATE, FERRITIN, TIBC, IRON, RETICCTPCT in the last 72 hours. Urine analysis:    Component Value Date/Time   COLORURINE YELLOW 02/06/2018 2101   APPEARANCEUR HAZY (A) 02/06/2018 2101   LABSPEC 1.010 02/06/2018 2101   PHURINE 5.0  02/06/2018 2101   GLUCOSEU NEGATIVE 02/06/2018 2101   HGBUR SMALL (A) 02/06/2018 2101   BILIRUBINUR NEGATIVE 02/06/2018 2101   KETONESUR NEGATIVE 02/06/2018 2101   PROTEINUR NEGATIVE 02/06/2018 2101   UROBILINOGEN 0.2 11/06/2009 1006   NITRITE NEGATIVE 02/06/2018 2101   LEUKOCYTESUR NEGATIVE 02/06/2018 2101   Sepsis Labs: @LABRCNTIP (procalcitonin:4,lacticidven:4)  )No results found for this or any previous visit (from the past 240 hours).       Radiology Studies: DG Knee 1-2 Views Right Result Date: 08/27/2024 EXAM: 1 OR 2 VIEW(S) XRAY OF THE RIGHT KNEE 08/27/2024 10:39:00 PM COMPARISON: None available. 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 No acute fracture. No malalignment. No significant joint effusion. SOFT TISSUES: The soft tissues are unremarkable. IMPRESSION: 1. 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 Electronically signed by: Oneil Devonshire MD 08/27/2024 10:44 PM EST RP Workstation: [id number]   DG Chest 1 View Result Date: 08/27/2024 EXAM: 1 VIEW(S) XRAY OF THE CHEST 08/27/2024 10:39:00 PM COMPARISON: None available. CLINICAL HISTORY: Pre-op exam FINDINGS: LUNGS AND PLEURA: No focal pulmonary opacity. No pleural effusion. No pneumothorax. HEART AND MEDIASTINUM: Calcified aorta. No acute abnormality of the cardiac and mediastinal silhouettes. BONES AND SOFT TISSUES: Degenerative changes of spine and shoulders. No acute osseous abnormality. IMPRESSION: 1. No acute process. Electronically signed by: Oneil Devonshire MD 08/27/2024 10:43 PM EST RP Workstation: [id number]   MR HIP RIGHT WO CONTRAST Result Date: 08/27/2024 CLINICAL DATA:  Hip fracture EXAM: MR OF THE RIGHT HIP WITHOUT CONTRAST TECHNIQUE: Multiplanar, multisequence MR imaging was performed. No intravenous contrast was administered. COMPARISON:  CT 08/27/2024, radiograph 08/27/2024 FINDINGS: Bones: Abnormal marrow  edema within the greater trochanter of the femur, corresponding to CT demonstrated fracture. This is contiguous with abnormal irregular linear T1 hypointense, T2 bright signal abnormality involving the intertrochanteric region of right femur with signal abnormality extending to the lesser trochanter of the femur and suspect for nondisplaced intertrochanteric fracture. Signal abnormality extends to the superior aspect of the base of the femoral neck. Abnormal edema extends to the proximal subtrochanteric shaft of the femur. Minimal nonspecific edema within the left pubic symphysis. No abnormal signal within the pubic rami, sacrum or included left femur. Small anterior acetabular cystic change. Articular cartilage and labrum Articular cartilage:  Noncontributory Labrum: Suboptimally assessed without intra-articular contrast. Grossly normal. Joint or bursal effusion Joint effusion:  No asymmetrical hip effusion Bursae: Noncontributory Muscles and tendons Muscles and tendons: Edema within the right gluteus and abductor muscles. No focal soft tissue hematoma. Other findings Miscellaneous: No evidence for a pelvic effusion. Suspicion of 2.2 cm right adnexal cyst, series 6, image 5. IMPRESSION: 1. Abnormal marrow edema within the greater trochanter of the right femur, corresponding to CT demonstrated fracture. This is contiguous with abnormal signal involving the intertrochanteric region of the right femur with signal abnormality extending to the lesser trochanter of the femur and suspect for nondisplaced intertrochanteric fracture. Signal abnormality extends to the superior aspect of the base of the femoral neck. Abnormal edema/suspected nondisplaced fracture extends to the proximal subtrochanteric shaft of the femur. 2. Edema within the right gluteus and abductor muscles. No focal soft tissue hematoma. 3. Suspicion of  2.2 cm right adnexal cyst. No follow-up imaging recommended. Note: This recommendation does not apply to  premenarchal patients and to those with increased risk (genetic, family history, elevated tumor markers or other high-risk factors) of ovarian cancer. Reference: JACR 2020 Feb; 17(2):248-254 Electronically Signed   By: Luke Bun M.D.   On: 08/27/2024 21:09   CT PELVIS WO CONTRAST Result Date: 08/27/2024 CLINICAL DATA:  Provided history: Hip trauma, fracture suspected, xray done EXAM: CT PELVIS WITHOUT CONTRAST TECHNIQUE: Multidetector CT imaging of the pelvis was performed following the standard protocol without intravenous contrast. RADIATION DOSE REDUCTION: This exam was performed according to the departmental dose-optimization program which includes automated exposure control, adjustment of the mA and/or kV according to patient size and/or use of iterative reconstruction technique. COMPARISON:  Hip radiograph earlier today FINDINGS: Bones/Joint/Cartilage Acute right proximal femur fracture, primarily involving the greater trochanter. Fracture is minimally comminuted. There is no convincing intertrochanteric component. Fracture extends to the base of the femoral neck. No hip dislocation. Moderate osteoarthritis of both hips, pubic symphysis and sacroiliac joints. Diffuse degenerative change in the included lumbar spine. Trace right hip joint effusion. Ligaments Suboptimally assessed by CT. Muscles and Tendons No intramuscular hematoma. Soft tissues No confluent soft tissue hematoma. Faint edema about the lateral right hip subcutaneous tissues. Aorto bi-iliac atherosclerosis. Colonic diverticulosis. Right renal cyst. No further follow-up imaging is recommended. IMPRESSION: 1. Acute right proximal femur fracture, primarily involving the greater trochanter. Fracture is minimally comminuted. There is no convincing intertrochanteric component. Fracture extends to the base of the femoral neck. 2. Moderate osteoarthritis of both hips, pubic symphysis and sacroiliac joints. Aortic Atherosclerosis (ICD10-I70.0).  Electronically Signed   By: Andrea Gasman M.D.   On: 08/27/2024 14:11   DG Lumbar Spine Complete Result Date: 08/27/2024 CLINICAL DATA:  Fall and back pain. EXAM: LUMBAR SPINE - COMPLETE 4+ VIEW COMPARISON:  None Available. FINDINGS: Five lumbar type vertebra. There is no acute fracture or subluxation of the lumbar spine. The bones are osteopenic. Multilevel degenerative changes with disc space narrowing and endplate irregularity and spurring. Multilevel facet arthropathy. The soft tissues are unremarkable. IMPRESSION: 1. No acute fracture or subluxation of the lumbar spine. 2. Multilevel degenerative changes. Electronically Signed   By: Vanetta Chou M.D.   On: 08/27/2024 12:33   DG Hip Unilat  With Pelvis 2-3 Views Right Result Date: 08/27/2024 CLINICAL DATA:  Fall. EXAM: DG HIP (WITH OR WITHOUT PELVIS) 2-3V RIGHT COMPARISON:  None Available. FINDINGS: No acute fracture or dislocation. The bones are osteopenic. Lower lumbar degenerative changes. The soft tissues are unremarkable. IMPRESSION: No acute fracture or dislocation. Electronically Signed   By: Vanetta Chou M.D.   On: 08/27/2024 12:21        Scheduled Meds:  acetaminophen   975 mg Oral Q8H   gabapentin   100 mg Oral TID   lidocaine   1 patch Transdermal Q24H   ondansetron  (ZOFRAN ) IV  4 mg Intravenous Once   Continuous Infusions:   LOS: 0 days    Time spent: 35 minutes    Leatrice Chapel, MD  [facility] Pager #: [phone] 7PM-7AM contact night coverage as above    "

## 2024-08-28 NOTE — ED Notes (Signed)
 Report given to OR RN Paralee in PACU [place] [facility]

## 2024-08-28 NOTE — Anesthesia Procedure Notes (Signed)
 Procedure Name: Intubation Date/Time: 08/28/2024 10:26 AM  Performed by: Arvell Edsel HERO, CRNAPre-anesthesia Checklist: Patient identified, Suction available, Patient being monitored, Timeout performed and Emergency Drugs available Patient Re-evaluated:Patient Re-evaluated prior to induction Oxygen Delivery Method: Circle system utilized Preoxygenation: Pre-oxygenation with 100% oxygen Induction Type: IV induction Ventilation: Mask ventilation without difficulty and Oral airway inserted - appropriate to patient size Laryngoscope Size: Mac and 3 Grade View: Grade I Tube type: Oral Tube size: 7.0 mm Number of attempts: 1 Airway Equipment and Method: Stylet and Patient positioned with wedge pillow Placement Confirmation: ETT inserted through vocal cords under direct vision, positive ETCO2 and breath sounds checked- equal and bilateral Secured at: 22 cm Tube secured with: Tape Dental Injury: Teeth and Oropharynx as per pre-operative assessment

## 2024-08-28 NOTE — H&P (Signed)
 " History and Physical    [name] FMW:[id number] DOB: 07-Jun-1946 DOA: 08/27/2024  PCP: [facility] [address]   Chief Complaint: Fall  HPI: Stacey Lambert is a 78 y.o. female with medical history significant of hypertension, hyperlipidemia, hyper Cardizem who presents emergency department after mechanical fall.  Patient lost her balance and fell on her right side.  She landed on her hip.  She denied losing consciousness or any head trauma.  She is been having persistent pain in the right hip area so she presented to the ER for further assessment.  On arrival she was afebrile and hemodynamically stable.  Labs were obtained on presentation which showed WBC 8.4, hemoglobin 11.7, BMP unrevealing.  Patient underwent x-ray hip which showed no evidence of fracture.  X-ray lumbar spine showed no evidence of fracture.  CT pelvis demonstrated acute right proximal femur fracture.  MRI hip showed fracture and adnexal cyst.  Chest x-ray showed no acute findings.  X-ray of knee showed total arthroplasty.  Orthopedics was consulted with plans for operative management.  Patient was admitted for further workup.   Review of Systems: Review of Systems  All other systems reviewed and are negative.    As per HPI otherwise 10 point review of systems negative.   Allergies[1]  Past Medical History:  Diagnosis Date   Hyperlipidemia    Hypertension    Thyroid disease     Past Surgical History:  Procedure Laterality Date   ABDOMINAL HYSTERECTOMY       reports that she has quit smoking. Her smoking use included cigarettes. She has never used smokeless tobacco. She reports that she does not drink alcohol  and does not use drugs.  Family History  Problem Relation Age of Onset   Breast cancer Mother        diagnosed late 97's    Prior to Admission medications  Medication Sig Start Date End Date Taking? Authorizing Provider  amLODipine  (NORVASC ) 10 MG tablet Take 10 mg by mouth daily. 08/03/24  Yes  [provider]  cetirizine (ZYRTEC) 5 MG tablet Take 5 mg by mouth at bedtime. 08/03/24  Yes [provider]  clopidogrel  (PLAVIX ) 75 MG tablet Take 75 mg by mouth daily.   Yes [provider]  FLUoxetine  (PROZAC ) 20 MG tablet Take 20 mg by mouth daily as needed (for depressive mood). 08/03/24  Yes [provider]  levothyroxine  (SYNTHROID , LEVOTHROID) 88 MCG tablet Take 88 mcg by mouth daily before breakfast. ON MON WED FRI ON ALL OTHER DAYS   Yes [provider]  LORazepam  (ATIVAN ) 1 MG tablet Take 1 mg by mouth daily as needed for anxiety.   Yes [provider]  meclizine (ANTIVERT) 25 MG tablet Take 12.5-25 mg by mouth daily as needed for dizziness. 08/03/24  Yes [provider]  rosuvastatin  (CRESTOR ) 10 MG tablet Take 10 mg by mouth at bedtime.   Yes [provider]  traMADol  (ULTRAM ) 50 MG tablet Take 1 tablet (50 mg total) by mouth every 6 (six) hours as needed for moderate pain. Patient taking differently: Take 50 mg by mouth at bedtime as needed for moderate pain (pain score 4-6). 02/11/18  Yes Cindy Garnette POUR, MD    Physical Exam: Vitals:   08/27/24 1420 08/27/24 1729 08/27/24 2059 08/28/24 0308  BP:  (!) 169/78 (!) 168/79 (!) 146/62  Pulse:  61 70 (!) 55  Resp:  16 20 18   Temp: 98.3 F (36.8 C) 98.6 F (37 C)  98.2 F (36.8 C)   TempSrc: Oral Oral Oral   SpO2:  99% 99% 99%  Weight:      Height:       Physical Exam Constitutional:      Appearance: She is normal weight.  HENT:     Head: Normocephalic.     Nose: Nose normal.     Mouth/Throat:     Mouth: Mucous membranes are moist.     Pharynx: Oropharynx is clear.  Eyes:     Conjunctiva/sclera: Conjunctivae normal.     Pupils: Pupils are equal, round, and reactive to light.  Cardiovascular:     Rate and Rhythm: Normal rate and regular rhythm.     Pulses: Normal pulses.     Heart sounds: Normal heart sounds.  Pulmonary:     Effort:  Pulmonary effort is normal.     Breath sounds: Normal breath sounds.  Abdominal:     General: Abdomen is flat. Bowel sounds are normal.     Palpations: Abdomen is soft.  Musculoskeletal:        General: Swelling and deformity present.     Cervical back: Normal range of motion.  Skin:    General: Skin is warm.     Capillary Refill: Capillary refill takes less than 2 seconds.  Neurological:     Mental Status: She is alert. Mental status is at baseline.  Psychiatric:        Mood and Affect: Mood normal.        Labs on Admission: I have personally reviewed the patients's labs and imaging studies.  Assessment/Plan Principal Problem:   Hip fracture (HCC)   # Mechanical fall complicated by right right femur fracture - Patient fell on right side - Persistent pain found to have femur fracture on MRI, CT and x-ray - Orthopedics consulted with plans for operative management  Plan: Management per OR/orthopedics  # Hypertension-can sinew home amlodipine ,  # Hypothyroidism-continue Synthroid   # Hyperlipidemia-continue Crestor     Admission status: Inpatient Med-Surg  Certification: The appropriate patient status for this patient is INPATIENT. Inpatient status is judged to be reasonable and necessary in order to provide the required intensity of service to ensure the patient's safety. The patient's presenting symptoms, physical exam findings, and initial radiographic and laboratory data in the context of their chronic comorbidities is felt to place them at high risk for further clinical deterioration. Furthermore, it is not anticipated that the patient will be medically stable for discharge from the hospital within 2 midnights of admission.   * I certify that at the point of admission it is my clinical judgment that the patient will require inpatient hospital care spanning beyond 2 midnights from the point of admission due to high intensity of service, high risk for further  deterioration and high frequency of surveillance required.DEWAINE Lamar Dess MD [facility] If 7PM-7AM, please contact night-coverage [email or web address]  08/28/2024, 4:59 AM        [1] No Known Allergies  "

## 2024-08-28 NOTE — Anesthesia Preprocedure Evaluation (Addendum)
"                                    Anesthesia Evaluation  Patient identified by MRN, date of birth, ID band Patient awake    Reviewed: Allergy & Precautions, H&P , NPO status , Patient's Chart, lab work & pertinent test results  Airway Mallampati: II  TM Distance: >3 FB Neck ROM: Full    Dental  (+) Edentulous Upper, Edentulous Lower   Pulmonary former smoker   Pulmonary exam normal breath sounds clear to auscultation       Cardiovascular hypertension, (-) Past MI Normal cardiovascular exam Rhythm:Regular Rate:Normal     Neuro/Psych  PSYCHIATRIC DISORDERS Anxiety     CVA    GI/Hepatic negative GI ROS, Neg liver ROS,,,  Endo/Other  Hypothyroidism    Renal/GU negative Renal ROS  negative genitourinary   Musculoskeletal Nondisplaced intertrochanteric femur fracture   Abdominal   Peds negative pediatric ROS (+)  Hematology negative hematology ROS (+)   Anesthesia Other Findings   Reproductive/Obstetrics negative OB ROS                              Anesthesia Physical Anesthesia Plan  ASA: 2  Anesthesia Plan: General   Post-op Pain Management: Tylenol  PO (pre-op)*   Induction: Intravenous  PONV Risk Score and Plan: 3 and Ondansetron , Dexamethasone  and Treatment may vary due to age or medical condition  Airway Management Planned: Oral ETT  Additional Equipment: None  Intra-op Plan:   Post-operative Plan: Extubation in OR  Informed Consent: I have reviewed the patients History and Physical, chart, labs and discussed the procedure including the risks, benefits and alternatives for the proposed anesthesia with the patient or authorized representative who has indicated his/her understanding and acceptance.     Dental advisory given  Plan Discussed with: CRNA  Anesthesia Plan Comments:          Anesthesia Quick Evaluation  "

## 2024-08-28 NOTE — Discharge Instructions (Signed)
 Orthopedic surgery discharge instructions:  Bandages: -Maintain postoperative bandage until follow-up appointment.  - Do not use any lotions or creams on or around the incision until instructed by your surgeon.  Showering -  This is waterproof, and you may begin showering on postoperative day #3. You should keep the bandage in place wrap it in plastic wrap at that point prior to showering  - Do not submerge the surgical site underwater.    Activity Restrictions Weight bearing as tolerated right lower extremity  Medications - For mild to moderate pain use Tylenol  as needed around-the-clock, 1000 mg every 8 h for pain. Do not take more than this higher levels can affect kidney function - For breakthrough pain use oxycodone  as necessary. - Please take a stool softener such as docusate and senna while taking a narcotic pain medication - Aspirin  81 mg twice daily x 6 weeks to avoid blood clots  Follow Up:  -You will return to see Dr. Teresa in the office in 2 weeks for routine postoperative check with x-rays. PT will begin after this visit.

## 2024-08-28 NOTE — Plan of Care (Signed)

## 2024-08-29 ENCOUNTER — Encounter (HOSPITAL_COMMUNITY): Payer: Self-pay

## 2024-08-29 LAB — BASIC METABOLIC PANEL WITH GFR
Anion gap: 12 (ref 5–15)
BUN: 13 mg/dL (ref 8–23)
CO2: 25 mmol/L (ref 22–32)
Calcium: 9.2 mg/dL (ref 8.9–10.3)
Chloride: 101 mmol/L (ref 98–111)
Creatinine, Ser: 0.64 mg/dL (ref 0.44–1.00)
GFR, Estimated: >60 mL/min
Glucose, Bld: 157 mg/dL — ABNORMAL HIGH (ref 70–99)
Potassium: 3.7 mmol/L (ref 3.5–5.1)
Sodium: 137 mmol/L (ref 135–145)

## 2024-08-29 LAB — CBC
HCT: 32.9 % — ABNORMAL LOW (ref 36.0–46.0)
Hemoglobin: 11.3 g/dL — ABNORMAL LOW (ref 12.0–15.0)
MCH: 27 pg (ref 26.0–34.0)
MCHC: 34.3 g/dL (ref 30.0–36.0)
MCV: 78.7 fL — ABNORMAL LOW (ref 80.0–100.0)
Platelets: 274 K/uL (ref 150–400)
RBC: 4.18 MIL/uL (ref 3.87–5.11)
RDW: 16.4 % — ABNORMAL HIGH (ref 11.5–15.5)
WBC: 11.8 K/uL — ABNORMAL HIGH (ref 4.0–10.5)
nRBC: 0 % (ref 0.0–0.2)

## 2024-08-29 LAB — MAGNESIUM: Magnesium: 1.8 mg/dL (ref 1.7–2.4)

## 2024-08-29 NOTE — Plan of Care (Signed)

## 2024-08-29 NOTE — Progress Notes (Signed)
 Transition of Care [facility]) - CAGE-AID Screening   Patient Details  Name: Stacey Lambert MRN: [id number] Date of Birth: 1946/01/29  Transition of Care [facility]) CM/SW Contact:    Sallyanne MALVA Mettle, RN Phone Number: 08/29/2024, 10:17 PM   Clinical Narrative:  Reports occasional wine cooler with her sisters-in-law, no resources indicated.  CAGE-AID Screening:    Have You Ever Felt You Ought to Cut Down on Your Drinking or Drug Use?: No Have People Annoyed You By Critizing Your Drinking Or Drug Use?: No Have You Felt Bad Or Guilty About Your Drinking Or Drug Use?: No Have You Ever Had a Drink or Used Drugs First Thing In The Morning to Steady Your Nerves or to Get Rid of a Hangover?: No CAGE-AID Score: 0  Substance Abuse Education Offered: No

## 2024-08-29 NOTE — Plan of Care (Signed)
 Pt took prn pain med x 1, anti nausea med x 1, vitals stable. No events to report  Problem: Education: Goal: Knowledge of General Education information will improve Description: Including pain rating scale, medication(s)/side effects and non-pharmacologic comfort measures Outcome: Progressing   Problem: Health Behavior/Discharge Planning: Goal: Ability to manage health-related needs will improve Outcome: Progressing   Problem: Clinical Measurements: Goal: Ability to maintain clinical measurements within normal limits will improve Outcome: Progressing Goal: Will remain free from infection Outcome: Progressing Goal: Diagnostic test results will improve Outcome: Progressing Goal: Respiratory complications will improve Outcome: Progressing Goal: Cardiovascular complication will be avoided Outcome: Progressing   Problem: Activity: Goal: Risk for activity intolerance will decrease Outcome: Progressing   Problem: Nutrition: Goal: Adequate nutrition will be maintained Outcome: Progressing   Problem: Coping: Goal: Level of anxiety will decrease Outcome: Progressing   Problem: Elimination: Goal: Will not experience complications related to bowel motility Outcome: Progressing Goal: Will not experience complications related to urinary retention Outcome: Progressing   Problem: Pain Managment: Goal: General experience of comfort will improve and/or be controlled Outcome: Progressing   Problem: Safety: Goal: Ability to remain free from injury will improve Outcome: Progressing   Problem: Skin Integrity: Goal: Risk for impaired skin integrity will decrease Outcome: Progressing

## 2024-08-29 NOTE — Evaluation (Signed)
 Occupational Therapy Evaluation Patient Details Name: Stacey Lambert MRN: [id number] DOB: [birth year] Today's Date: 08/29/2024   History of Present Illness   78 yo female, presented to ED 08/27/24 with fall prior evening landing on her R hip and buttock area  X-ray of the right hip and CT scan of the pelvis revealed non displaced comminuted greater trochanteric femur fracture with extension to the superior femoral neck without clear extension across the femoral neck or into the intertrochanteric femur. S/p IM nail R intertrochanteric femur fx 12/21. PMH: HTN, HLD, hyper Cardizem.     Clinical Impressions PTA, pt lived with son and reports being independent. Upon eval, pt needing up to min A for LB ADL; limited by R hip pain. Pt mobilizing with min A progressing to CGA during session and handed off to PT at end of OT session. Suspect pt will progress well. Pt to continue to benefit from acute OT services. Recommending discharge home with HHOT and support from family.      If plan is discharge home, recommend the following:   A little help with walking and/or transfers;A little help with bathing/dressing/bathroom;Assistance with cooking/housework;Assist for transportation;Help with stairs or ramp for entrance     Functional Status Assessment   Patient has had a recent decline in their functional status and demonstrates the ability to make significant improvements in function in a reasonable and predictable amount of time.     Equipment Recommendations   None recommended by OT     Recommendations for Other Services         Precautions/Restrictions   Precautions Precautions: Fall Restrictions Weight Bearing Restrictions Per Provider Order: Yes Other Position/Activity Restrictions: RLE WBAT     Mobility Bed Mobility Overal bed mobility: Needs Assistance Bed Mobility: Supine to Sit     Supine to sit: Supervision          Transfers Overall transfer level: Needs  assistance Equipment used: Rolling walker (2 wheels) Transfers: Sit to/from Stand Sit to Stand: Min assist, Contact guard assist           General transfer comment: Min A up from EOB for light rise and stedy assist progressing to CGA up from toilet      Balance Overall balance assessment: Needs assistance Sitting-balance support: No upper extremity supported, Feet supported Sitting balance-Leahy Scale: Good     Standing balance support: Bilateral upper extremity supported, During functional activity, No upper extremity supported Standing balance-Leahy Scale: Fair Standing balance comment: RW dynamically                           ADL either performed or assessed with clinical judgement   ADL Overall ADL's : Needs assistance/impaired Eating/Feeding: Modified independent;Bed level   Grooming: Wash/dry face;Wash/dry hands;Contact guard assist;Standing   Upper Body Bathing: Set up;Sitting   Lower Body Bathing: Minimal assistance;Sit to/from stand   Upper Body Dressing : Set up;Sitting   Lower Body Dressing: Minimal assistance;Sit to/from stand   Toilet Transfer: Ambulation;Contact guard assist;Rolling walker (2 wheels)   Toileting- Clothing Manipulation and Hygiene: Sitting/lateral lean;Set up;Contact guard assist;Sit to/from stand       Functional mobility during ADLs: Contact guard assist;Rolling walker (2 wheels)       Vision Patient Visual Report: No change from baseline Vision Assessment?: No apparent visual deficits     Perception Perception: Within Functional Limits       Praxis Praxis: [facility]       Pertinent  Vitals/Pain Pain Assessment Pain Assessment: 0-10 Pain Score: 6  Pain Location: R hip Pain Descriptors / Indicators: Sore Pain Intervention(s): Limited activity within patient's tolerance, Monitored during session, Premedicated before session     Extremity/Trunk Assessment Upper Extremity Assessment Upper Extremity Assessment:  Overall WFL for tasks assessed   Lower Extremity Assessment Lower Extremity Assessment: Defer to PT evaluation       Communication Communication Communication: No apparent difficulties   Cognition Arousal: Alert Behavior During Therapy: WFL for tasks assessed/performed Cognition: No apparent impairments                               Following commands: Intact       Cueing  General Comments   Cueing Techniques: Verbal cues      Exercises     Shoulder Instructions      Home Living Family/patient expects to be discharged to:: Private residence Living Arrangements: Children Available Help at Discharge: Family;Available PRN/intermittently Type of Home: House Home Access: Stairs to enter;Ramped entrance Entrance Stairs-Number of Steps: 3 Entrance Stairs-Rails: Right;Left;Can reach both Home Layout: One level     Bathroom Shower/Tub: Walk-in shower;Curtain   Bathroom Toilet: Handicapped height     Home Equipment: Wheelchair - manual;Cane - single Librarian, Academic (2 wheels);Shower seat;Grab bars - tub/shower          Prior Functioning/Environment Prior Level of Function : Independent/Modified Independent;Driving             Mobility Comments: Uses the cane normally. ADLs Comments: Drives, normally goes out to the stores to shop    OT Problem List: Decreased strength;Decreased activity tolerance;Impaired balance (sitting and/or standing);Decreased knowledge of use of DME or AE;Decreased knowledge of precautions   OT Treatment/Interventions: Self-care/ADL training;Therapeutic exercise;DME and/or AE instruction;Therapeutic activities;Patient/family education;Balance training      OT Goals(Current goals can be found in the care plan section)   Acute Rehab OT Goals Patient Stated Goal: get better OT Goal Formulation: With patient Time For Goal Achievement: 09/12/24 Potential to Achieve Goals: Good   OT Frequency:  Min 2X/week     Co-evaluation              AM-PAC OT 6 Clicks Daily Activity     Outcome Measure Help from another person eating meals?: None Help from another person taking care of personal grooming?: A Little Help from another person toileting, which includes using toliet, bedpan, or urinal?: A Little Help from another person bathing (including washing, rinsing, drying)?: A Little Help from another person to put on and taking off regular upper body clothing?: A Little Help from another person to put on and taking off regular lower body clothing?: A Little 6 Click Score: 19   End of Session Equipment Utilized During Treatment: Gait belt;Rolling walker (2 wheels) Nurse Communication: Mobility status  Activity Tolerance: Patient tolerated treatment well Patient left: Other (comment) (up in room with PT)  OT Visit Diagnosis: Unsteadiness on feet (R26.81);Muscle weakness (generalized) (M62.81);Pain                Time: 9084-9074 OT Time Calculation (min): 10 min Charges:  OT General Charges $OT Visit: 1 Visit OT Evaluation $OT Eval Low Complexity: 1 Low  Stacey Lambert, OTR/L [facility] Acute Rehabilitation Office: [phone]   Stacey Lambert 08/29/2024, 9:51 AM

## 2024-08-29 NOTE — TOC Initial Note (Signed)
 Transition of Care [facility]) - Initial/Assessment Note    Patient Details  Name: Stacey Lambert MRN: [id number] Date of Birth: 1945/11/26  Transition of Care [facility]) CM/SW Contact:    Stacey KANDICE Stain, RN Phone Number: 08/29/2024, 2:59 PM  Clinical Narrative:                 Spoke to patient regarding transition needs.  Patient lives with son who works. Patient will have family/ friends to assist as needed.  Patient has walker and cane at home.  Patient is agreeable to home health. This RNCM offered choice for Home Health, Stacey Lambert states she has no preference, RNCM made referral to [facility] with Hedda, He is able to take referral.  Address, Phone number and PCP verified. Patient has transportation home.   Need home health PT, OT, aide orders.  Expected Discharge Plan: Home w Home Health Services Barriers to Discharge: Continued Medical Work up   Patient Goals and CMS Choice Patient states their goals for this hospitalization and ongoing recovery are:: return home CMS Medicare.gov Compare Post Acute Care list provided to:: Patient Choice offered to / list presented to : Patient      Expected Discharge Plan and Services   Discharge Planning Services: CM Consult Post Acute Care Choice: Home Health Living arrangements for the past 2 months: Single Family Home                           HH Arranged: PT, OT HH Agency: [facility] Date [facility] Agency Contacted: 08/29/24 Time HH Agency Contacted: [phone] Representative spoke with at [facility] Agency: Darleene  Prior Living Arrangements/Services Living arrangements for the past 2 months: Single Family Home Lives with:: Adult Children (son) Patient language and need for interpreter reviewed:: Yes Do you feel safe going back to the place where you live?: Yes      Need for Family Participation in Patient Care: Yes (Comment) Care giver support system in place?: Yes (comment) Current home services: DME (cane, walker) Criminal  Activity/Legal Involvement Pertinent to Current Situation/Hospitalization: No - Comment as needed  Activities of Daily Living   ADL Screening (condition at time of admission) Independently performs ADLs?: Yes (appropriate for developmental age) Is the patient deaf or have difficulty hearing?: No Does the patient have difficulty seeing, even when wearing glasses/contacts?: No Does the patient have difficulty concentrating, remembering, or making decisions?: No  Permission Sought/Granted                  Emotional Assessment Appearance:: Appears stated age Attitude/Demeanor/Rapport: Engaged Affect (typically observed): Accepting Orientation: : Oriented to Self, Oriented to Place, Oriented to  Time, Oriented to Situation Alcohol  / Substance Use: Not Applicable Psych Involvement: No (comment)  Admission diagnosis:  Hip fracture (HCC) [S72.009A] Closed fracture of right hip, initial encounter [facility]) [S72.001A] Patient Active Problem List   Diagnosis Date Noted   Hip fracture (HCC) 08/28/2024   Benign essential HTN 02/08/2018   Hypothyroidism 02/08/2018   Cellulitis 02/06/2018   Fever 02/06/2018   PCP:  [facility] [address] Pharmacy:   [id number] [place], [place] - [address] AT [facility] [address] [place] [address] Phone: [phone] Fax: [phone]  [id number] - [place], [place] - [address] AT [facility] [address] [place] [address] Phone: [phone] Fax: [phone]     Social  Drivers of Health (SDOH) Social History: SDOH Screenings   Food Insecurity: No Food Insecurity (08/28/2024)  Housing: Low Risk (08/28/2024)  Transportation Needs: No Transportation Needs (08/28/2024)  Utilities: Not At Risk (08/28/2024)  Social Connections: Moderately Integrated (08/28/2024)  Tobacco Use: Medium Risk (08/28/2024)   SDOH Interventions:     Readmission Risk Interventions      No data to display

## 2024-08-29 NOTE — Evaluation (Signed)
 Physical Therapy Evaluation  Patient Details Name: Stacey Lambert MRN: [id number] DOB: [birth year] Today's Date: 08/29/2024  History of Present Illness  Pt is an 78 y/o female, presented to ED 08/27/24 with fall prior evening landing on her R hip and buttock area X-ray of the right hip and CT scan of the pelvis revealed non displaced comminuted greater trochanteric femur fracture with extension to the superior femoral neck without clear extension across the femoral neck or into the intertrochanteric femur. S/p IM nail R intertrochanteric femur fx 12/21. PMH: HTN, hypothyroidism.   Clinical Impression  Pt admitted with above diagnosis. Pt currently with functional limitations due to the deficits listed below (see PT Problem List). At the time of PT eval pt was able to perform transfers and ambulation with gross CGA to min assist and RW for support. Anticipate pt will progress well. Pt will benefit from acute skilled PT to increase their independence and safety with mobility to allow discharge.           If plan is discharge home, recommend the following: A little help with walking and/or transfers;A little help with bathing/dressing/bathroom;Assistance with cooking/housework;Assist for transportation;Help with stairs or ramp for entrance   Can travel by private vehicle        Equipment Recommendations None recommended by PT  Recommendations for Other Services       Functional Status Assessment Patient has had a recent decline in their functional status and demonstrates the ability to make significant improvements in function in a reasonable and predictable amount of time.     Precautions / Restrictions Precautions Precautions: Fall Recall of Precautions/Restrictions: Intact Restrictions Weight Bearing Restrictions Per Provider Order: Yes RLE Weight Bearing Per Provider Order: Weight bearing as tolerated      Mobility  Bed Mobility Overal bed mobility: Needs Assistance Bed  Mobility: Supine to Sit     Supine to sit: Supervision     General bed mobility comments: HOB slightly elevated and min use of rails    Transfers Overall transfer level: Needs assistance Equipment used: Rolling walker (2 wheels) Transfers: Sit to/from Stand Sit to Stand: Contact guard assist, Min assist           General transfer comment: Min A up from EOB for light rise and stedy assist progressing to CGA up from toilet    Ambulation/Gait Ambulation/Gait assistance: Contact guard assist Gait Distance (Feet): 75 Feet Assistive device: Rolling walker (2 wheels) Gait Pattern/deviations: Step-through pattern, Decreased stride length, Trunk flexed Gait velocity: Decreased Gait velocity interpretation: 1.31 - 2.62 ft/sec, indicative of limited community ambulator   General Gait Details: VC's for sequencing and general safety with RW for support. Pt was cued for improved step-through pattern and pt was able to make corrective changes.  Stairs Stairs:  (Deferred as pt has a wheelchair and ramp available at home.)          Wheelchair Mobility     Tilt Bed    Modified Rankin (Stroke Patients Only)       Balance Overall balance assessment: Needs assistance Sitting-balance support: No upper extremity supported, Feet supported Sitting balance-Leahy Scale: Good     Standing balance support: Bilateral upper extremity supported, During functional activity, No upper extremity supported Standing balance-Leahy Scale: Fair Standing balance comment: RW dynamically                             Pertinent Vitals/Pain Pain Assessment Pain Assessment: 0-10  Pain Score: 6  Pain Location: R hip Pain Descriptors / Indicators: Operative site guarding, Sore Pain Intervention(s): Limited activity within patient's tolerance, Monitored during session, Repositioned    Home Living Family/patient expects to be discharged to:: Private residence Living Arrangements:  Children Available Help at Discharge: Family;Available PRN/intermittently Type of Home: House Home Access: Stairs to enter;Ramped entrance Entrance Stairs-Rails: Right;Left;Can reach both Entrance Stairs-Number of Steps: 3   Home Layout: One level Home Equipment: Wheelchair - manual;Cane - single Librarian, Academic (2 wheels);Shower seat;Grab bars - tub/shower      Prior Function Prior Level of Function : Independent/Modified Independent;Driving             Mobility Comments: Uses the cane normally. ADLs Comments: Drives, normally goes out to the stores to shop     Extremity/Trunk Assessment   Upper Extremity Assessment Upper Extremity Assessment: Overall WFL for tasks assessed    Lower Extremity Assessment Lower Extremity Assessment: RLE deficits/detail RLE Deficits / Details: Acute pain, decreased strength and AROM consistent with pre-op diagnosis and subsequent surgery. RLE: Unable to fully assess due to pain    Cervical / Trunk Assessment Cervical / Trunk Assessment: Normal  Communication   Communication Communication: No apparent difficulties    Cognition Arousal: Alert Behavior During Therapy: WFL for tasks assessed/performed   PT - Cognitive impairments: No apparent impairments                         Following commands: Intact       Cueing Cueing Techniques: Gestural cues, Verbal cues     General Comments      Exercises General Exercises - Lower Extremity Ankle Circles/Pumps: 10 reps, Right, AROM Quad Sets: 10 reps, Right, AROM Long Arc Quad: 10 reps, Right, AROM Hip ABduction/ADduction: 10 reps, Right, AROM (hip adduction isometric with pillow)   Assessment/Plan    PT Assessment Patient needs continued PT services  PT Problem List Decreased strength;Decreased range of motion;Decreased activity tolerance;Decreased balance;Decreased mobility;Decreased safety awareness;Decreased knowledge of use of DME;Pain       PT Treatment  Interventions DME instruction;Gait training;Stair training;Functional mobility training;Therapeutic activities;Therapeutic exercise;Balance training;Patient/family education    PT Goals (Current goals can be found in the Care Plan section)  Acute Rehab PT Goals Patient Stated Goal: Home at d/c PT Goal Formulation: With patient Time For Goal Achievement: 09/05/24 Potential to Achieve Goals: Good    Frequency Min 2X/week     Co-evaluation               AM-PAC PT 6 Clicks Mobility  Outcome Measure Help needed turning from your back to your side while in a flat bed without using bedrails?: A Little Help needed moving from lying on your back to sitting on the side of a flat bed without using bedrails?: A Little Help needed moving to and from a bed to a chair (including a wheelchair)?: A Little Help needed standing up from a chair using your arms (e.g., wheelchair or bedside chair)?: A Little Help needed to walk in hospital room?: A Little Help needed climbing 3-5 steps with a railing? : A Little 6 Click Score: 18    End of Session Equipment Utilized During Treatment: Gait belt Activity Tolerance: Patient tolerated treatment well Patient left: in chair;with call bell/phone within reach;with chair alarm set Nurse Communication: Mobility status PT Visit Diagnosis: Unsteadiness on feet (R26.81);Pain Pain - Right/Left: Right Pain - part of body: Hip    Time: 9088-9064 PT  Time Calculation (min) (ACUTE ONLY): 24 min   Charges:   PT Evaluation $PT Eval Moderate Complexity: 1 Mod PT Treatments $Gait Training: 8-22 mins PT General Charges $$ ACUTE PT VISIT: 1 Visit         Leita Sable, PT, DPT Acute Rehabilitation Services Secure Chat Preferred Office: [phone]   Leita JONETTA Sable 08/29/2024, 10:36 AM

## 2024-08-29 NOTE — Progress Notes (Signed)
 " PROGRESS NOTE    Stacey Lambert  FMW:[id number] DOB: Jan 03, 1946 DOA: 08/27/2024 PCP: [facility], [facility]  Outpatient Specialists:     Brief Narrative:  Patient is an 78 year old female with past medical history significant for hypertension, CVA, thyroid disease and hyperlipidemia.  Patient was admitted earlier today with acute right proximal femur fracture, primarily involving the greater trochanter, minimally comminuted, following a mechanical fall.  Patient underwent fixation of the intertrochanteric fracture with intramedullary rod.  08/28/2024: Patient seen postop.  Pain is reasonably controlled.  Continue to optimize pain control.  Monitor blood pressure closely. 08/29/2024: Patient seen.  Patient is slowly improving.  Patient continues to report pain.  Patient is not keen on being discharged back home today.  Pursue discharge in the next 1 to 2 days, with home health PT/OT.   Assessment & Plan:   Principal Problem:   Hip fracture (HCC)   Mechanical fall complicated by right right femur fracture: - Patient fell on right side - Persistent pain found, and was found to have femur fracture on MRI, CT and x-ray - Orthopedics consulted, patient has undergone surgery (see above documentation). - Postop management as per orthopedic team. - Continue to optimize pain control.   - Likely discharge back home in the next 1 to 2 days with home health PT/OT.  Hypertension: - Blood pressure control is improving. - Continue to optimize pain control.    Hypothyroidism: -continue Synthroid    Hyperlipidemia: -continue Crestor      DVT prophylaxis: Subcutaneous Lovenox  Code Status: Full code Family Communication:  Disposition Plan: This will depend on hospital course.   Consultants:  Orthopedic surgery.  Procedures:  Intramedullary fixation of right femur fracture.  Antimicrobials:  Perioperative cefazolin  discontinued.   Subjective: Pain is reasonably  controlled.  Objective: Vitals:   08/29/24 0004 08/29/24 0747 08/29/24 1137 08/29/24 1553  BP: (!) 150/69 128/68 138/67 (!) 146/73  Pulse: 64 64 69 65  Resp: 18 18 18 18   Temp: 98.8 F (37.1 C) 98.3 F (36.8 C) 98.2 F (36.8 C) 98 F (36.7 C)  TempSrc: Oral Oral Oral Oral  SpO2: 96% 97% 100% 100%  Weight:      Height:       No intake or output data in the 24 hours ending 08/29/24 1722 Filed Weights   08/27/24 1032  Weight: 62.1 kg    Examination:  General exam: Appears calm and comfortable  Respiratory system: Clear to auscultation. Respiratory effort normal. Cardiovascular system: S1 & S2 heard Gastrointestinal system: Abdomen is soft and nontender.  Central nervous system: Awake and alert.    Data Reviewed: I have personally reviewed following labs and imaging studies  CBC: Recent Labs  Lab 08/27/24 1235 08/28/24 1520 08/29/24 0452  WBC 8.4 11.0* 11.8*  HGB 11.7* 12.3 11.3*  HCT 35.3* 36.5 32.9*  MCV 81.0 79.5* 78.7*  PLT 321 313 274   Basic Metabolic Panel: Recent Labs  Lab 08/27/24 1235 08/28/24 1520 08/29/24 0452  NA 140  --  137  K 4.0  --  3.7  CL 107  --  101  CO2 22  --  25  GLUCOSE 103*  --  157*  BUN 10  --  13  CREATININE 0.47 0.48 0.64  CALCIUM  9.2  --  9.2  MG  --   --  1.8   GFR: Estimated Creatinine Clearance: 50.5 mL/min (by C-G formula based on SCr of 0.64 mg/dL). Liver Function Tests: No results for input(s): AST, ALT, ALKPHOS, BILITOT,  PROT, ALBUMIN in the last 168 hours. No results for input(s): LIPASE, AMYLASE in the last 168 hours. No results for input(s): AMMONIA in the last 168 hours. Coagulation Profile: Recent Labs  Lab 08/28/24 0504  INR 1.0   Cardiac Enzymes: No results for input(s): CKTOTAL, CKMB, CKMBINDEX, TROPONINI in the last 168 hours. BNP (last 3 results) No results for input(s): PROBNP in the last 8760 hours. HbA1C: No results for input(s): HGBA1C in the last 72  hours. CBG: No results for input(s): GLUCAP in the last 168 hours. Lipid Profile: No results for input(s): CHOL, HDL, LDLCALC, TRIG, CHOLHDL, LDLDIRECT in the last 72 hours. Thyroid Function Tests: No results for input(s): TSH, T4TOTAL, FREET4, T3FREE, THYROIDAB in the last 72 hours. Anemia Panel: No results for input(s): VITAMINB12, FOLATE, FERRITIN, TIBC, IRON, RETICCTPCT in the last 72 hours. Urine analysis:    Component Value Date/Time   COLORURINE YELLOW 02/06/2018 2101   APPEARANCEUR HAZY (A) 02/06/2018 2101   LABSPEC 1.010 02/06/2018 2101   PHURINE 5.0 02/06/2018 2101   GLUCOSEU NEGATIVE 02/06/2018 2101   HGBUR SMALL (A) 02/06/2018 2101   BILIRUBINUR NEGATIVE 02/06/2018 2101   KETONESUR NEGATIVE 02/06/2018 2101   PROTEINUR NEGATIVE 02/06/2018 2101   UROBILINOGEN 0.2 11/06/2009 1006   NITRITE NEGATIVE 02/06/2018 2101   LEUKOCYTESUR NEGATIVE 02/06/2018 2101   Sepsis Labs: @LABRCNTIP (procalcitonin:4,lacticidven:4)  )No results found for this or any previous visit (from the past 240 hours).       Radiology Studies: DG HIP UNILAT WITH PELVIS 2-3 VIEWS RIGHT Result Date: 08/28/2024 CLINICAL DATA:  Elective surgery. EXAM: DG HIP (WITH OR WITHOUT PELVIS) 2-3V RIGHT COMPARISON:  None Available. FINDINGS: Intraoperative fluoroscopic spot images of the right hip provided. The total fluoroscopic time is 124.4 seconds with a cumulative air kerma of 14.76 mGy. ORIF of the right femoral neck. IMPRESSION: Intraoperative fluoroscopic spot images of the right hip. Electronically Signed   By: Vanetta Chou M.D.   On: 08/28/2024 12:52   DG C-Arm 1-60 Min-No Report Result Date: 08/28/2024 Fluoroscopy was utilized by the requesting physician.  No radiographic interpretation.   DG C-Arm 1-60 Min-No Report Result Date: 08/28/2024 Fluoroscopy was utilized by the requesting physician.  No radiographic interpretation.   DG Knee 1-2 Views Right Result  Date: 08/27/2024 EXAM: 1 OR 2 VIEW(S) XRAY OF THE RIGHT KNEE 08/27/2024 10:39:00 PM COMPARISON: None available. 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 No acute fracture. No malalignment. No significant joint effusion. SOFT TISSUES: The soft tissues are unremarkable. IMPRESSION: 1. 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 Electronically signed by: Oneil Devonshire MD 08/27/2024 10:44 PM EST RP Workstation: [id number]   DG Chest 1 View Result Date: 08/27/2024 EXAM: 1 VIEW(S) XRAY OF THE CHEST 08/27/2024 10:39:00 PM COMPARISON: None available. CLINICAL HISTORY: Pre-op exam FINDINGS: LUNGS AND PLEURA: No focal pulmonary opacity. No pleural effusion. No pneumothorax. HEART AND MEDIASTINUM: Calcified aorta. No acute abnormality of the cardiac and mediastinal silhouettes. BONES AND SOFT TISSUES: Degenerative changes of spine and shoulders. No acute osseous abnormality. IMPRESSION: 1. No acute process. Electronically signed by: Oneil Devonshire MD 08/27/2024 10:43 PM EST RP Workstation: [id number]   MR HIP RIGHT WO CONTRAST Result Date: 08/27/2024 CLINICAL DATA:  Hip fracture EXAM: MR OF THE RIGHT HIP WITHOUT CONTRAST TECHNIQUE: Multiplanar, multisequence MR imaging was performed. No intravenous contrast was administered. COMPARISON:  CT 08/27/2024, radiograph 08/27/2024 FINDINGS: Bones: Abnormal marrow edema within the greater trochanter of the femur, corresponding  to CT demonstrated fracture. This is contiguous with abnormal irregular linear T1 hypointense, T2 bright signal abnormality involving the intertrochanteric region of right femur with signal abnormality extending to the lesser trochanter of the femur and suspect for nondisplaced intertrochanteric fracture. Signal abnormality extends to the superior aspect of the base of the femoral neck. Abnormal edema extends to the proximal subtrochanteric shaft of the  femur. Minimal nonspecific edema within the left pubic symphysis. No abnormal signal within the pubic rami, sacrum or included left femur. Small anterior acetabular cystic change. Articular cartilage and labrum Articular cartilage:  Noncontributory Labrum: Suboptimally assessed without intra-articular contrast. Grossly normal. Joint or bursal effusion Joint effusion:  No asymmetrical hip effusion Bursae: Noncontributory Muscles and tendons Muscles and tendons: Edema within the right gluteus and abductor muscles. No focal soft tissue hematoma. Other findings Miscellaneous: No evidence for a pelvic effusion. Suspicion of 2.2 cm right adnexal cyst, series 6, image 5. IMPRESSION: 1. Abnormal marrow edema within the greater trochanter of the right femur, corresponding to CT demonstrated fracture. This is contiguous with abnormal signal involving the intertrochanteric region of the right femur with signal abnormality extending to the lesser trochanter of the femur and suspect for nondisplaced intertrochanteric fracture. Signal abnormality extends to the superior aspect of the base of the femoral neck. Abnormal edema/suspected nondisplaced fracture extends to the proximal subtrochanteric shaft of the femur. 2. Edema within the right gluteus and abductor muscles. No focal soft tissue hematoma. 3. Suspicion of 2.2 cm right adnexal cyst. No follow-up imaging recommended. Note: This recommendation does not apply to premenarchal patients and to those with increased risk (genetic, family history, elevated tumor markers or other high-risk factors) of ovarian cancer. Reference: JACR 2020 Feb; 17(2):248-254 Electronically Signed   By: Luke Bun M.D.   On: 08/27/2024 21:09        Scheduled Meds:  acetaminophen   975 mg Oral Q8H   amLODipine   10 mg Oral Daily   docusate sodium   100 mg Oral BID   enoxaparin  (LOVENOX ) injection  40 mg Subcutaneous Q24H   gabapentin   100 mg Oral TID   levothyroxine   88 mcg Oral Q0600    lidocaine   1 patch Transdermal Q24H   ondansetron  (ZOFRAN ) IV  4 mg Intravenous Once   rosuvastatin   10 mg Oral QHS   Continuous Infusions:   LOS: 1 day    Time spent: 35 minutes    Leatrice Chapel, MD  [facility] Pager #: [phone] 7PM-7AM contact night coverage as above    "

## 2024-08-29 NOTE — Progress Notes (Signed)
" ° °  Subjective:  78 y.o. female now POD 1 from right femoral cephalomedullary nail for occult intertrochanteric femur fracture. Patient reports that her pain is well controlled and improved from pre op.  She ambulated without need for much assistance with physical therapy today  Otherwise, no acute events overnight. No new numbness or tingling to the extremity.  Objective:   VITALS:   Vitals:   08/29/24 0004 08/29/24 0747 08/29/24 1137 08/29/24 1553  BP: (!) 150/69 128/68 138/67 (!) 146/73  Pulse: 64 64 69 65  Resp: 18 18 18 18   Temp: 98.8 F (37.1 C) 98.3 F (36.8 C) 98.2 F (36.8 C) 98 F (36.7 C)  TempSrc: Oral Oral Oral Oral  SpO2: 96% 97% 100% 100%  Weight:      Height:        Physical Exam: General: Alert, no acute distress Cardiovascular: No pedal edema Respiratory: No cyanosis, no use of accessory musculature GI: No organomegaly, abdomen is soft and non-tender Skin: No lesions in the area of chief complaint Neurologic: Sensation intact distally Psychiatric: Patient is competent for consent with normal mood and affect Lymphatic: No axillary or cervical lymphadenopathy  MUSCULOSKELETAL: Right lower extremity - Dressings clean/dry/intact - TTP peri incisionally - Motor intact L3-S1 - Sensation intact to light touch L3-S1 nerve distributions except for 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 female  now 1 Day Post-Op from Principal Problem:   Hip  fracture (HCC) .  Patient is doing well.  She is planned to discharge home tomorrow with home health pending completion of stairs with physical therapy.  Recommend aspirin  81 mg BID x 6 weeks upon discharge for DVT ppx and she will follow up in my clinic in 2 weeks for skin check and repeat x-rays  Weight bearing: WBAT RLE Pain control: oxy and tylenol  PT/OT DVT ppx: Lovenox  while in house, ASA 81 mg BID outpatient Bowel regimen: docusate Dispo: Likely home tomorrow   Rankin LELON Pizza 08/29/2024, 5:55 PM  "

## 2024-08-29 NOTE — TOC CM/SW Note (Signed)
 Transition of Care [facility]) - Inpatient Brief Assessment   Patient Details  Name: Stacey Lambert MRN: [id number] Date of Birth: 05/03/1946  [facility] CM/SW Contact:    Lauraine FORBES Saa, LCSWA Phone Number: 08/29/2024, 9:48 AM   Clinical Narrative:  9:48 AM Per chart review, patient resides at home with child(ren). Patient has a PCP and insurance. Patient does not have SNF/HH/DME history. Patient's preferred pharmacy's are [facility] [address] [facility] and [facility] [phone] [facility]. No TOC needs identified at this time. TOC will continue to follow.  Transition of Care Asessment: Insurance and Status: Insurance coverage has been reviewed Patient has primary care physician: Yes Home environment has been reviewed: Private Residence Prior level of function:: N/A Prior/Current Home Services: No current home services Social Drivers of Health Review: SDOH reviewed no interventions necessary Readmission risk has been reviewed: Yes (Currently Green 11%) Transition of care needs: no transition of care needs at this time

## 2024-08-30 LAB — CBC WITH DIFFERENTIAL/PLATELET
Abs Immature Granulocytes: 0.03 K/uL (ref 0.00–0.07)
Basophils Absolute: 0 K/uL (ref 0.0–0.1)
Basophils Relative: 0 %
Eosinophils Absolute: 0.3 K/uL (ref 0.0–0.5)
Eosinophils Relative: 3 %
HCT: 33.5 % — ABNORMAL LOW (ref 36.0–46.0)
Hemoglobin: 11.5 g/dL — ABNORMAL LOW (ref 12.0–15.0)
Immature Granulocytes: 0 %
Lymphocytes Relative: 21 %
Lymphs Abs: 1.9 K/uL (ref 0.7–4.0)
MCH: 27.3 pg (ref 26.0–34.0)
MCHC: 34.3 g/dL (ref 30.0–36.0)
MCV: 79.4 fL — ABNORMAL LOW (ref 80.0–100.0)
Monocytes Absolute: 0.8 K/uL (ref 0.1–1.0)
Monocytes Relative: 9 %
Neutro Abs: 6.1 K/uL (ref 1.7–7.7)
Neutrophils Relative %: 67 %
Platelets: 275 K/uL (ref 150–400)
RBC: 4.22 MIL/uL (ref 3.87–5.11)
RDW: 16.3 % — ABNORMAL HIGH (ref 11.5–15.5)
WBC: 9.2 K/uL (ref 4.0–10.5)
nRBC: 0 % (ref 0.0–0.2)

## 2024-08-30 MED ORDER — OXYCODONE HCL 10 MG PO TABS: 10.0000 mg | ORAL_TABLET | ORAL | 0 refills | Status: AC | PRN

## 2024-08-30 MED ORDER — GABAPENTIN 100 MG PO CAPS: 100.0000 mg | ORAL_CAPSULE | Freq: Three times a day (TID) | ORAL | 0 refills | Status: AC

## 2024-08-30 NOTE — TOC CM/SW Note (Cosign Needed Addendum)
 The patient had right femoral cephalomedullary nail for occult intertrochanteric femur fracture and experiences frequent, urgent toileting needs. She is high risk for falls after procedure. Because the bathroom is not attached to or in close proximity to the patients bedroom, accessing it safely and in a timely manner is difficult. A 3-in-1 bedside commode is medically necessary to reduce fall risk, improve safety, and meet the patients toileting need

## 2024-08-30 NOTE — Progress Notes (Signed)
 Physical Therapy Treatment  Patient Details Name: Stacey Lambert MRN: [id number] DOB: 08-Mar-1946 Today's Date: 08/30/2024   History of Present Illness Pt is an 78 y/o female, presented to ED 08/27/24 with fall prior evening landing on her R hip and buttock area X-ray of the right hip and CT scan of the pelvis revealed non displaced comminuted greater trochanteric femur fracture with extension to the superior femoral neck without clear extension across the femoral neck or into the intertrochanteric femur. S/p IM nail R intertrochanteric femur fx 12/21. PMH: HTN, hypothyroidism.    PT Comments  Pt progressing well towards physical therapy goals. Was able to perform transfers and ambulation with gross CGA to supervision for safety and RW for support. Pt completed stair training and progressed ambulation distance during gait training. Reviewed HEP and car transfer. Will continue to follow and progress as able per POC.     If plan is discharge home, recommend the following: A little help with walking and/or transfers;A little help with bathing/dressing/bathroom;Assistance with cooking/housework;Assist for transportation;Help with stairs or ramp for entrance   Can travel by private vehicle        Equipment Recommendations  None recommended by PT    Recommendations for Other Services       Precautions / Restrictions Precautions Precautions: Fall Recall of Precautions/Restrictions: Intact Restrictions Weight Bearing Restrictions Per Provider Order: No RLE Weight Bearing Per Provider Order: Weight bearing as tolerated     Mobility  Bed Mobility Overal bed mobility: Modified Independent Bed Mobility: Supine to Sit           General bed mobility comments: Increased time but able to transition to EOB without assist.    Transfers Overall transfer level: Needs assistance Equipment used: Rolling walker (2 wheels) Transfers: Sit to/from Stand Sit to Stand: Supervision            General transfer comment: Light supervision for safety as pt powered up to full stand. No assist from toilet height. Pt initially cued for hand placement on seated surface for safety.    Ambulation/Gait Ambulation/Gait assistance: Contact guard assist, Supervision Gait Distance (Feet): 300 Feet (100+200) Assistive device: Rolling walker (2 wheels) Gait Pattern/deviations: Step-through pattern, Decreased stride length, Trunk flexed Gait velocity: Decreased Gait velocity interpretation: 1.31 - 2.62 ft/sec, indicative of limited community ambulator   General Gait Details: VC's for sequencing and general safety with RW for support. Pt was cued for improved step-through pattern and pt was able to make corrective changes.   Stairs Stairs: Yes Stairs assistance: Contact guard assist Stair Management: Two rails, Step to pattern, Forwards Number of Stairs: 4 General stair comments: CGA and multimodal cues for sequencing and general safety. No knee buckling noted. Pt was able to complete stair training without physical assist.   Wheelchair Mobility     Tilt Bed    Modified Rankin (Stroke Patients Only)       Balance Overall balance assessment: Needs assistance Sitting-balance support: No upper extremity supported, Feet supported Sitting balance-Leahy Scale: Good     Standing balance support: Bilateral upper extremity supported, During functional activity, No upper extremity supported Standing balance-Leahy Scale: Fair Standing balance comment: RW dynamically                            Communication Communication Communication: No apparent difficulties  Cognition Arousal: Alert Behavior During Therapy: WFL for tasks assessed/performed   PT - Cognitive impairments: No apparent impairments  Following commands: Intact      Cueing Cueing Techniques: Gestural cues, Verbal cues  Exercises General Exercises - Lower Extremity Ankle  Circles/Pumps: 10 reps, Right, AROM, Supine Quad Sets: 10 reps, Right, AROM, Supine Long Arc Quad: 10 reps, Right, AROM, Seated Heel Slides: 10 reps, Right, AROM, Supine Hip ABduction/ADduction: 10 reps, Right, AROM, Supine    General Comments        Pertinent Vitals/Pain Pain Assessment Pain Location: R hip Pain Descriptors / Indicators: Operative site guarding, Sore    Home Living                          Prior Function            PT Goals (current goals can now be found in the care plan section) Acute Rehab PT Goals Patient Stated Goal: Home today PT Goal Formulation: With patient Time For Goal Achievement: 09/05/24 Potential to Achieve Goals: Good Progress towards PT goals: Progressing toward goals    Frequency    Min 2X/week      PT Plan      Co-evaluation              AM-PAC PT 6 Clicks Mobility   Outcome Measure  Help needed turning from your back to your side while in a flat bed without using bedrails?: A Little Help needed moving from lying on your back to sitting on the side of a flat bed without using bedrails?: A Little Help needed moving to and from a bed to a chair (including a wheelchair)?: A Little Help needed standing up from a chair using your arms (e.g., wheelchair or bedside chair)?: A Little Help needed to walk in hospital room?: A Little Help needed climbing 3-5 steps with a railing? : A Little 6 Click Score: 18    End of Session Equipment Utilized During Treatment: Gait belt Activity Tolerance: Patient tolerated treatment well Patient left: in chair;with call bell/phone within reach;with chair alarm set Nurse Communication: Mobility status PT Visit Diagnosis: Unsteadiness on feet (R26.81);Pain Pain - Right/Left: Right Pain - part of body: Hip     Time: 8865-8789 PT Time Calculation (min) (ACUTE ONLY): 36 min  Charges:    $Gait Training: 8-22 mins $Therapeutic Exercise: 8-22 mins PT General Charges $$  ACUTE PT VISIT: 1 Visit                     Leita Sable, PT, DPT Acute Rehabilitation Services Secure Chat Preferred Office: [phone]    Leita JONETTA Sable 08/30/2024, 1:28 PM

## 2024-08-30 NOTE — Discharge Summary (Signed)
 Physician Discharge Summary  Stacey Lambert FMW:[id number] DOB: 18-Jan-1946 DOA: 08/27/2024  PCP: [facility] [address] [facility]  Admit date: 08/27/2024 Discharge date: 08/30/2024  Time spent: 45 minutes  Recommendations for Outpatient Follow-up:  Home health PT OT ordered for patient Chem-7 and CBC in about 1 week Follow-up as an outpatient with Dr. Rankin Pizza of orthopedics who has been CC Should follow-up with Dr. 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 Pizza who I will CC Aspirin  81 twice daily at discharge in addition to Plavix  and then discontinue aspirin  as per Dr. Pizza  Previous CAD NSTEMI with clean cath Crestor  10 zotepine 10 Plavix  continued  Thyroid ophthalmoplegia Continue levothyroxine  88 mcg daily Tells me that she had some issues with vision for a while I did manual testing today she has central blurred vision which is not too different from her prior She has seen Dr. Octavia for a long time-this can be managed in the outpatient  setting-- I do not suspect any new acute process   Discharge Exam: Vitals:   08/30/24 0312 08/30/24 0743  BP: 129/80 128/60  Pulse: 60 64  Resp: 18 18  Temp: 99.4 F (37.4 C) 98.1 F (36.7 C)  SpO2: 98% 98%    Subj on day of d/c   Awake coherent pleasant no distress a little bit of blurred vision as above  General Exam on discharge  EOMI-patient tracking in all quadrants central quadrant she does have a little bit of double vision-I do not appreciate nystagmus downwards from top to bottom but ongoing upwards I do appreciate some S1-S2 no murmur Abdomen soft no rebound no guarding ROM intact  Discharge Instructions    Allergies as of 08/30/2024   No Known Allergies      Medication List     STOP taking these medications    cetirizine 5 MG tablet Commonly known as: ZYRTEC   traMADol  50 MG tablet Commonly known as: ULTRAM        TAKE these medications    acetaminophen  500 MG tablet Commonly known as: TYLENOL  Take 2 tablets (1,000 mg total) by mouth every 8 (eight) hours for 20 days.   amLODipine  10 MG tablet Commonly known as: NORVASC  Take 10 mg by mouth daily.   aspirin  EC 81 MG tablet Take 1 tablet (81 mg total) by mouth 2 (two) times daily. Swallow whole.   clopidogrel  75 MG tablet Commonly known as: PLAVIX  Take 75 mg by mouth daily.   FLUoxetine  20 MG tablet Commonly known as: PROZAC  Take 20 mg by mouth daily as needed (for depressive mood).   gabapentin  100 MG capsule Commonly  known as: NEURONTIN  Take 1 capsule (100 mg total) by mouth 3 (three) times daily.   levothyroxine  88 MCG tablet Commonly known as: SYNTHROID  Take 88 mcg by mouth daily before breakfast. ON MON WED FRI ON ALL OTHER DAYS   LORazepam  1 MG tablet Commonly known as: ATIVAN  Take 1 mg by mouth daily as needed for anxiety.   meclizine 25 MG tablet Commonly known as: ANTIVERT Take 12.5-25 mg by mouth daily as needed for dizziness.   Oxycodone  HCl 10 MG  Tabs Take 1 tablet (10 mg total) by mouth every 4 (four) hours as needed for severe pain (pain score 7-10).   rosuvastatin  10 MG tablet Commonly known as: CRESTOR  Take 10 mg by mouth at bedtime.   senna-docusate 8.6-50 MG tablet Commonly known as: Senokot-S Take 1 tablet by mouth daily for 20 days.               Discharge Care Instructions  (From admission, onward)           Start     Ordered   08/30/24 0000  Leave dressing on - Keep it clean, dry, and intact until clinic visit        08/30/24 1415           Allergies[1]  Follow-up Information     Teresa Rankin ORN, MD Follow up in 2 week(s).   Specialty: Orthopedic Surgery Contact information: [address] [place] [address] [phone]         [facility] Follow up.   Specialty: Home Health Services Why: Home health has been arranged. Contact information: [address] [place] [address] [phone]                  The results of significant diagnostics from this hospitalization (including imaging, microbiology, ancillary and laboratory) are listed below for reference.    Significant Diagnostic Studies: DG HIP UNILAT WITH PELVIS 2-3 VIEWS RIGHT Result Date: 08/28/2024 CLINICAL DATA:  Elective surgery. EXAM: DG HIP (WITH OR WITHOUT PELVIS) 2-3V RIGHT COMPARISON:  None Available. FINDINGS: Intraoperative fluoroscopic spot images of the right hip provided. The total fluoroscopic time is 124.4 seconds with a cumulative air kerma of 14.76 mGy. ORIF of the right femoral neck. IMPRESSION: Intraoperative fluoroscopic spot images of the right hip. Electronically Signed   By: Vanetta Chou M.D.   On: 08/28/2024 12:52   DG C-Arm 1-60 Min-No Report Result Date: 08/28/2024 Fluoroscopy was utilized by the requesting physician.  No radiographic interpretation.   DG C-Arm 1-60 Min-No Report Result Date: 08/28/2024 Fluoroscopy was utilized by the  requesting physician.  No radiographic interpretation.   DG Knee 1-2 Views Right Result Date: 08/27/2024 EXAM: 1 OR 2 VIEW(S) XRAY OF THE RIGHT KNEE 08/27/2024 10:39:00 PM COMPARISON: None available. 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 No acute fracture. No malalignment. No significant joint effusion. SOFT TISSUES: The soft tissues are unremarkable. IMPRESSION: 1. 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 Electronically signed by: Oneil Devonshire MD 08/27/2024 10:44 PM EST RP Workstation: [id number]   DG Chest 1 View Result Date: 08/27/2024 EXAM: 1 VIEW(S) XRAY OF THE CHEST 08/27/2024 10:39:00 PM COMPARISON: None available. CLINICAL HISTORY: Pre-op exam FINDINGS: LUNGS AND PLEURA: No focal pulmonary opacity. No pleural effusion. No pneumothorax. HEART AND MEDIASTINUM: Calcified aorta. No acute abnormality of the cardiac and mediastinal silhouettes. BONES AND SOFT TISSUES: Degenerative changes of  spine and shoulders. No acute osseous abnormality. IMPRESSION: 1. No acute process. Electronically signed by: Oneil Devonshire MD 08/27/2024 10:43 PM EST RP Workstation: [id number]   MR HIP RIGHT WO CONTRAST Result Date: 08/27/2024 CLINICAL DATA:  Hip fracture EXAM: MR OF THE RIGHT HIP WITHOUT CONTRAST TECHNIQUE: Multiplanar, multisequence MR imaging was performed. No intravenous contrast was administered. COMPARISON:  CT 08/27/2024, radiograph 08/27/2024 FINDINGS: Bones: Abnormal marrow edema within the greater trochanter of the femur, corresponding to CT demonstrated fracture. This is contiguous with abnormal irregular linear T1 hypointense, T2 bright signal abnormality involving the intertrochanteric region of right femur with signal abnormality extending to the lesser trochanter of the femur and suspect for nondisplaced intertrochanteric fracture. Signal abnormality extends to the superior aspect of the  base of the femoral neck. Abnormal edema extends to the proximal subtrochanteric shaft of the femur. Minimal nonspecific edema within the left pubic symphysis. No abnormal signal within the pubic rami, sacrum or included left femur. Small anterior acetabular cystic change. Articular cartilage and labrum Articular cartilage:  Noncontributory Labrum: Suboptimally assessed without intra-articular contrast. Grossly normal. Joint or bursal effusion Joint effusion:  No asymmetrical hip effusion Bursae: Noncontributory Muscles and tendons Muscles and tendons: Edema within the right gluteus and abductor muscles. No focal soft tissue hematoma. Other findings Miscellaneous: No evidence for a pelvic effusion. Suspicion of 2.2 cm right adnexal cyst, series 6, image 5. IMPRESSION: 1. Abnormal marrow edema within the greater trochanter of the right femur, corresponding to CT demonstrated fracture. This is contiguous with abnormal signal involving the intertrochanteric region of the right femur with signal abnormality extending to the lesser trochanter of the femur and suspect for nondisplaced intertrochanteric fracture. Signal abnormality extends to the superior aspect of the base of the femoral neck. Abnormal edema/suspected nondisplaced fracture extends to the proximal subtrochanteric shaft of the femur. 2. Edema within the right gluteus and abductor muscles. No focal soft tissue hematoma. 3. Suspicion of 2.2 cm right adnexal cyst. No follow-up imaging recommended. Note: This recommendation does not apply to premenarchal patients and to those with increased risk (genetic, family history, elevated tumor markers or other high-risk factors) of ovarian cancer. Reference: JACR 2020 Feb; 17(2):248-254 Electronically Signed   By: Luke Bun M.D.   On: 08/27/2024 21:09   CT PELVIS WO CONTRAST Result Date: 08/27/2024 CLINICAL DATA:  Provided history: Hip trauma, fracture suspected, xray done EXAM: CT PELVIS WITHOUT CONTRAST  TECHNIQUE: Multidetector CT imaging of the pelvis was performed following the standard protocol without intravenous contrast. RADIATION DOSE REDUCTION: This exam was performed according to the departmental dose-optimization program which includes automated exposure control, adjustment of the mA and/or kV according to patient size and/or use of iterative reconstruction technique. COMPARISON:  Hip radiograph earlier today FINDINGS: Bones/Joint/Cartilage Acute right proximal femur fracture, primarily involving the greater trochanter. Fracture is minimally comminuted. There is no convincing intertrochanteric component. Fracture extends to the base of the femoral neck. No hip dislocation. Moderate osteoarthritis of both hips, pubic symphysis and sacroiliac joints. Diffuse degenerative change in the included lumbar spine. Trace right hip joint effusion. Ligaments Suboptimally assessed by CT. Muscles and Tendons No intramuscular hematoma. Soft tissues No confluent soft tissue hematoma. Faint edema about the lateral right hip subcutaneous tissues. Aorto bi-iliac atherosclerosis. Colonic diverticulosis. Right renal cyst. No further follow-up imaging is recommended. IMPRESSION: 1. Acute right proximal femur fracture, primarily involving the greater trochanter. Fracture is minimally comminuted. There is no convincing intertrochanteric component. Fracture extends to the base of the femoral neck. 2.  Moderate osteoarthritis of both hips, pubic symphysis and sacroiliac joints. Aortic Atherosclerosis (ICD10-I70.0). Electronically Signed   By: Andrea Gasman M.D.   On: 08/27/2024 14:11   DG Lumbar Spine Complete Result Date: 08/27/2024 CLINICAL DATA:  Fall and back pain. EXAM: LUMBAR SPINE - COMPLETE 4+ VIEW COMPARISON:  None Available. FINDINGS: Five lumbar type vertebra. There is no acute fracture or subluxation of the lumbar spine. The bones are osteopenic. Multilevel degenerative changes with disc space narrowing and  endplate irregularity and spurring. Multilevel facet arthropathy. The soft tissues are unremarkable. IMPRESSION: 1. No acute fracture or subluxation of the lumbar spine. 2. Multilevel degenerative changes. Electronically Signed   By: Vanetta Chou M.D.   On: 08/27/2024 12:33   DG Hip Unilat  With Pelvis 2-3 Views Right Result Date: 08/27/2024 CLINICAL DATA:  Fall. EXAM: DG HIP (WITH OR WITHOUT PELVIS) 2-3V RIGHT COMPARISON:  None Available. FINDINGS: No acute fracture or dislocation. The bones are osteopenic. Lower lumbar degenerative changes. The soft tissues are unremarkable. IMPRESSION: No acute fracture or dislocation. Electronically Signed   By: Vanetta Chou M.D.   On: 08/27/2024 12:21    Microbiology: No results found for this or any previous visit (from the past 240 hours).   Labs: Basic Metabolic Panel: Recent Labs  Lab 08/27/24 1235 08/28/24 1520 08/29/24 0452  NA 140  --  137  K 4.0  --  3.7  CL 107  --  101  CO2 22  --  25  GLUCOSE 103*  --  157*  BUN 10  --  13  CREATININE 0.47 0.48 0.64  CALCIUM  9.2  --  9.2  MG  --   --  1.8   Liver Function Tests: No results for input(s): AST, ALT, ALKPHOS, BILITOT, PROT, ALBUMIN in the last 168 hours. No results for input(s): LIPASE, AMYLASE in the last 168 hours. No results for input(s): AMMONIA in the last 168 hours. CBC: Recent Labs  Lab 08/27/24 1235 08/28/24 1520 08/29/24 0452 08/30/24 0502  WBC 8.4 11.0* 11.8* 9.2  NEUTROABS  --   --   --  6.1  HGB 11.7* 12.3 11.3* 11.5*  HCT 35.3* 36.5 32.9* 33.5*  MCV 81.0 79.5* 78.7* 79.4*  PLT 321 313 274 275   Cardiac Enzymes: No results for input(s): CKTOTAL, CKMB, CKMBINDEX, TROPONINI in the last 168 hours. BNP: BNP (last 3 results) No results for input(s): BNP in the last 8760 hours.  ProBNP (last 3 results) No results for input(s): PROBNP in the last 8760 hours.  CBG: No results for input(s): GLUCAP in the last 168  hours.  Signed:  Colen Grimes MD   [facility] 08/30/2024, 2:11 PM      [1] No Known Allergies

## 2024-08-30 NOTE — TOC Transition Note (Signed)
 Transition of Care [facility]) - Discharge Note   Patient Details  Name: Stacey Lambert MRN: [id number] Date of Birth: October 10, 1945  [facility]) CM/SW Contact:  Justina Delcia Czar, RN Phone Number: [phone] 08/30/2024, 3:44 PM   Clinical Narrative:    Spoke to pt and son via phone. Pt lives with son, Krystal. She has RW at home. Requesting 3n1 bedside commode. Contacted [employer] rep, Thomasina and they will ship to home. Son will provide transportation home. Contacted Hedda char, Cory for [employer] home today.   Scheduled PCP appt with [facility] 09/06/2024 at 11 am.   Faxed dc summary to PCP office.    Final next level of care: Home w Home Health Services Barriers to Discharge: No Barriers Identified   Patient Goals and CMS Choice Patient states their goals for this hospitalization and ongoing recovery are:: return home CMS Medicare.gov Compare Post Acute Care list provided to:: Patient Choice offered to / list presented to : Patient      Discharge Placement                       Discharge Plan and Services Additional resources added to the After Visit Summary for     Discharge Planning Services: CM Consult Post Acute Care Choice: Home Health          DME Arranged: 3-N-1 DME Agency: [employer]       HH Arranged: PT, OT HH Agency: [facility] Date [facility] Agency Contacted: 08/30/24 Time HH Agency Contacted: 1544 Representative spoke with at [facility] Agency: Darleene  Social Drivers of Health (SDOH) Interventions SDOH Screenings   Food Insecurity: No Food Insecurity (08/28/2024)  Housing: Low Risk (08/28/2024)  Transportation Needs: No Transportation Needs (08/28/2024)  Utilities: Not At Risk (08/28/2024)  Social Connections: Moderately Integrated (08/28/2024)  Tobacco Use: Medium Risk (08/28/2024)     Readmission Risk Interventions    08/30/2024    3:13 PM  Readmission Risk Prevention Plan  Post Dischage Appt Complete  Medication Screening Complete   Transportation Screening Complete

## 2024-08-30 NOTE — Progress Notes (Signed)
" ° °  Subjective:  78 y.o. female now POD 2 from right hip cephalomedullary nail insertion for occult intertrochanteric femur fracture. Patient reports that hip and right leg pain has significantly improved.  She has ambulated successfully with physical therapy and has been cleared for discharge home. Otherwise, no acute events overnight. No numbness or tingling to the extremity.  Objective:   VITALS:   Vitals:   08/29/24 2339 08/30/24 0312 08/30/24 0743 08/30/24 1519  BP: 135/71 129/80 128/60 (!) 148/80  Pulse: 62 60 64 71  Resp: 16 18 18 18   Temp: 98.1 F (36.7 C) 99.4 F (37.4 C) 98.1 F (36.7 C) 98 F (36.7 C)  TempSrc: Oral Oral Oral Oral  SpO2: 95% 98% 98% 99%  Weight:      Height:        Physical Exam: General: Alert, no acute distress Cardiovascular: No pedal edema Respiratory: No cyanosis, no use of accessory musculature GI: No organomegaly, abdomen is soft and non-tender Skin: No lesions in the area of chief complaint Neurologic: Sensation intact distally Psychiatric: Patient is competent for consent with normal mood and affect Lymphatic: No axillary or cervical lymphadenopathy  MUSCULOSKELETAL: right lower extremity - Dressing c/d/i - TTP peri incisionally - Motor intact to L3-S1 nerve distributions - Sensation intact to light touch L3-S1 nerve distributions - 2+ DP pulse  Lab Results  Component Value Date   WBC 9.2 08/30/2024   HGB 11.5 (L) 08/30/2024   HCT 33.5 (L) 08/30/2024   MCV 79.4 (L) 08/30/2024   PLT 275 08/30/2024   BMET    Component Value Date/Time   NA 137 08/29/2024 0452   K 3.7 08/29/2024 0452   CL 101 08/29/2024 0452   CO2 25 08/29/2024 0452   GLUCOSE 157 (H) 08/29/2024 0452   BUN 13 08/29/2024 0452   CREATININE 0.64 08/29/2024 0452   CALCIUM  9.2 08/29/2024 0452   GFRNONAA >60 08/29/2024 0452     Assessment/Plan: 78 y.o. female  now 2 Days Post-Op from Principal Problem:   Hip fracture (HCC) .  Patient is doing well. Plan for  discharge home today and follow up in my clinic in 2 weeks.  Weight bearing: as tolerated right lower extremity Pain control: oxy and tylenol  PT/OT DVT ppx: ASA 81 mg BID x 6 weeks Bowel regimen: docusate  Dispo: discharge today   Rankin LELON Pizza 08/30/2024, 9:13 PM  "

## 2024-08-30 NOTE — Plan of Care (Signed)
" °  Problem: Education: Goal: Knowledge of General Education information will improve Description: Including pain rating scale, medication(s)/side effects and non-pharmacologic comfort measures Outcome: Adequate for Discharge   Problem: Health Behavior/Discharge Planning: Goal: Ability to manage health-related needs will improve Outcome: Adequate for Discharge   Problem: Clinical Measurements: Goal: Ability to maintain clinical measurements within normal limits will improve Outcome: Adequate for Discharge Goal: Will remain free from infection Outcome: Adequate for Discharge Goal: Diagnostic test results will improve Outcome: Adequate for Discharge Goal: Respiratory complications will improve Outcome: Adequate for Discharge Goal: Cardiovascular complication will be avoided Outcome: Adequate for Discharge   Problem: Activity: Goal: Risk for activity intolerance will decrease Outcome: Adequate for Discharge   Problem: Nutrition: Goal: Adequate nutrition will be maintained Outcome: Adequate for Discharge   Problem: Coping: Goal: Level of anxiety will decrease Outcome: Adequate for Discharge   Problem: Elimination: Goal: Will not experience complications related to bowel motility Outcome: Adequate for Discharge Goal: Will not experience complications related to urinary retention Outcome: Adequate for Discharge   Problem: Pain Managment: Goal: General experience of comfort will improve and/or be controlled Outcome: Adequate for Discharge   Problem: Safety: Goal: Ability to remain free from injury will improve Outcome: Adequate for Discharge   Problem: Skin Integrity: Goal: Risk for impaired skin integrity will decrease Outcome: Adequate for Discharge   Problem: Acute Rehab OT Goals (only OT should resolve) Goal: Pt. Will Perform Grooming Outcome: Adequate for Discharge Goal: Pt. Will Perform Lower Body Dressing Outcome: Adequate for Discharge Goal: Pt. Will Transfer To  Toilet Outcome: Adequate for Discharge Goal: Pt. Will Perform Tub/Shower Transfer Outcome: Adequate for Discharge   Problem: Acute Rehab PT Goals(only PT should resolve) Goal: Pt Will Go Supine/Side To Sit Outcome: Adequate for Discharge Goal: Patient Will Transfer Sit To/From Stand Outcome: Adequate for Discharge Goal: Pt Will Ambulate Outcome: Adequate for Discharge   "

## 2024-09-21 ENCOUNTER — Ambulatory Visit
Admission: RE | Admit: 2024-09-21 | Discharge: 2024-09-21 | Disposition: A | Discharge status: Home / Self Care | Source: Ambulatory Visit | Attending: Family | Admitting: Family

## 2024-09-21 DIAGNOSIS — Z1231 Encounter for screening mammogram for malignant neoplasm of breast: Secondary | ICD-10-CM
